# Patient Record
Sex: Male | Born: 1960
Health system: Southern US, Community
[De-identification: ages and names within clinical notes are randomized; demographics above are authoritative.]

## PROBLEM LIST (undated history)

## (undated) DIAGNOSIS — Z8619 Personal history of other infectious and parasitic diseases: Secondary | ICD-10-CM

## (undated) DIAGNOSIS — E669 Obesity, unspecified: Secondary | ICD-10-CM

## (undated) HISTORY — DX: Obesity, unspecified: E66.9

## (undated) HISTORY — DX: Personal history of other infectious and parasitic diseases: Z86.19

---

## 2011-05-27 ENCOUNTER — Ambulatory Visit: Payer: Self-pay | Admitting: Family Medicine

## 2011-05-30 ENCOUNTER — Ambulatory Visit: Payer: Self-pay | Admitting: Family Medicine

## 2011-06-13 ENCOUNTER — Encounter: Payer: Self-pay | Admitting: Family Medicine

## 2011-06-13 ENCOUNTER — Ambulatory Visit (INDEPENDENT_AMBULATORY_CARE_PROVIDER_SITE_OTHER): Payer: BC Managed Care – PPO | Admitting: Family Medicine

## 2011-06-13 VITALS — BP 126/78 | HR 82 | Temp 97.3°F | Ht 74.0 in | Wt 291.2 lb

## 2011-06-13 DIAGNOSIS — E669 Obesity, unspecified: Secondary | ICD-10-CM

## 2011-06-13 DIAGNOSIS — IMO0001 Reserved for inherently not codable concepts without codable children: Secondary | ICD-10-CM | POA: Insufficient documentation

## 2011-06-13 DIAGNOSIS — Z0000 Encounter for general adult medical examination without abnormal findings: Secondary | ICD-10-CM

## 2011-06-13 NOTE — Assessment & Plan Note (Signed)
Preventative protocols reviewed and updated unless pt declined. Return fasting for blood work. Discussed healthy diet/living.

## 2011-06-13 NOTE — Patient Instructions (Signed)
Good to meet you today! Call us with questions. Return at your convenience fasting for blood work. We will check cholesterol and sugar. Return in 1 year for next physical.

## 2011-06-13 NOTE — Progress Notes (Signed)
Subjective:    Patient ID: Martin Herring, male    DOB: 04-22-1960, 51 y.o.   MRN: [id number]  HPI CC: new pt establish  No recent PCP.  Glaucoma - detected 1995, on meds initially but currently off, gets checked yearly at [facility].  No questions or concerns today.  Did have fall and bruised tailbone recently.  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 Off and on back pain - sees chiropractor.  L 3,4,5 disc bulge intermittently  Preventative: No recent CPE. Colon screen - no fmhx colon cancer.  No blood in stool.  No BM changes. Prostate screen - no fmhx prostate cancer.  No nocturia.  Average stream. Tetanus - 2011, stitches to hand Flu - no flu shot Seat belt - 100% use discussed (doesn't do) Sunscreen use discussed.  Head wear discussed  Body mass index is 37.39 kg/(m^2).  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  Past histories reviewed and updated if relevant as below. There is no problem list on file for this patient.  Past Medical History  Diagnosis Date  . History of chicken pox   . Glaucoma     No treatment currently; under close observation  . Obesity    No past surgical history on file. History  Substance Use Topics  . Smoking status: Never Smoker   . Smokeless tobacco: Current User    Types: Chew   Comment: 1/2 ppd chewing  . Alcohol Use: No   Family History  Problem Relation Age of Onset  . Stroke Father 6  . Diabetes Paternal Uncle   . Cancer Paternal Uncle     lung (smoker), pancreas  . Coronary artery disease Paternal Grandmother 38    MI  . Stroke Paternal Grandmother 108   Allergies  Allergen Reactions  . Penicillins Other (See Comments)    Unknown reaction; was told as a  child he had a reaction   No current outpatient prescriptions on file prior to visit.    Review of Systems  Constitutional: Negative for fever, chills, activity change, appetite change, fatigue and unexpected weight change.  HENT: Negative for hearing loss and neck pain.   Eyes: Negative for visual disturbance.  Respiratory: Negative for cough, chest tightness, shortness of breath and wheezing.   Cardiovascular: Negative for chest pain, palpitations and leg swelling.  Gastrointestinal: Negative for nausea, vomiting, abdominal pain, diarrhea, constipation, blood in stool and abdominal distention.  Genitourinary: Negative for hematuria and difficulty urinating.  Musculoskeletal: Negative for myalgias and arthralgias.  Skin: Negative for rash.  Neurological: Negative for dizziness, seizures, syncope and headaches.  Hematological: Does not bruise/bleed easily.  Psychiatric/Behavioral: Negative for dysphoric mood. The patient is not nervous/anxious.        Objective:   Physical Exam  Nursing note and vitals reviewed. Constitutional: He is oriented to person, place, and time. He appears well-developed and well-nourished. No distress.  HENT:  Head: Normocephalic and atraumatic.  Right Ear: Hearing, tympanic membrane, external ear and ear canal normal.  Left Ear: Hearing, tympanic membrane, external ear and ear canal normal.  Nose: Nose normal.  Mouth/Throat: Uvula is midline, oropharynx is clear and moist and mucous membranes are normal. No oropharyngeal exudate, posterior oropharyngeal edema, posterior oropharyngeal erythema or tonsillar abscesses.  Eyes: Conjunctivae  and EOM are normal. Pupils are equal, round, and reactive to light. No scleral icterus.  Neck: Normal range of motion. Neck supple.  Cardiovascular: Normal rate, regular rhythm, normal heart sounds and intact distal pulses.   No murmur heard. Pulses:      Radial pulses are 2+ on the right side, and 2+ on the left side.    Pulmonary/Chest: Effort normal and breath sounds normal. No respiratory distress. He has no wheezes. He has no rales.  Abdominal: Soft. Bowel sounds are normal. He exhibits no distension and no mass. There is no tenderness. There is no rebound and no guarding.  Musculoskeletal: Normal range of motion. He exhibits no edema.  Lymphadenopathy:    He has no cervical adenopathy.  Neurological: He is alert and oriented to person, place, and time.       CN grossly intact, station and gait intact  Skin: Skin is warm and dry. No rash noted.  Psychiatric: He has a normal mood and affect. His behavior is normal. Judgment and thought content normal.       Assessment & Plan:

## 2011-06-14 ENCOUNTER — Other Ambulatory Visit (INDEPENDENT_AMBULATORY_CARE_PROVIDER_SITE_OTHER): Payer: BC Managed Care – PPO

## 2011-06-14 DIAGNOSIS — Z0000 Encounter for general adult medical examination without abnormal findings: Secondary | ICD-10-CM

## 2011-06-14 DIAGNOSIS — E039 Hypothyroidism, unspecified: Secondary | ICD-10-CM

## 2011-06-14 LAB — LIPID PANEL
Cholesterol: 179 mg/dL (ref 0–200)
Total CHOL/HDL Ratio: 4
Triglycerides: 136 mg/dL (ref 0.0–149.0)

## 2011-06-14 LAB — COMPREHENSIVE METABOLIC PANEL
AST: 21 U/L (ref 0–37)
Albumin: 4.1 g/dL (ref 3.5–5.2)
BUN: 17 mg/dL (ref 6–23)
Calcium: 9.5 mg/dL (ref 8.4–10.5)
Chloride: 107 mEq/L (ref 96–112)
Glucose, Bld: 87 mg/dL (ref 70–99)
Potassium: 4.6 mEq/L (ref 3.5–5.1)

## 2011-06-14 LAB — TSH: TSH: 0.85 u[IU]/mL (ref 0.35–5.50)

## 2011-06-14 NOTE — Progress Notes (Signed)
Addended by: Eustaquio Boyden on: 06/14/2011 07:45 AM   Modules accepted: Orders

## 2011-07-11 ENCOUNTER — Telehealth: Payer: Self-pay | Admitting: *Deleted

## 2011-07-11 NOTE — Telephone Encounter (Signed)
Patient never received lab results. I was never made aware to notify patient. Message left notifying him of normal results. Advised of error and to call with any questions or concerns.

## 2011-07-11 NOTE — Telephone Encounter (Signed)
Noted thanks °

## 2011-07-11 NOTE — Telephone Encounter (Signed)
Opened in error

## 2011-09-27 ENCOUNTER — Encounter: Payer: Self-pay | Admitting: Family Medicine

## 2011-09-27 ENCOUNTER — Ambulatory Visit (INDEPENDENT_AMBULATORY_CARE_PROVIDER_SITE_OTHER): Payer: BC Managed Care – PPO | Admitting: Family Medicine

## 2011-09-27 VITALS — BP 110/80 | HR 68 | Temp 98.0°F | Wt 288.2 lb

## 2011-09-27 DIAGNOSIS — R220 Localized swelling, mass and lump, head: Secondary | ICD-10-CM

## 2011-09-27 DIAGNOSIS — R221 Localized swelling, mass and lump, neck: Secondary | ICD-10-CM

## 2011-09-27 NOTE — Assessment & Plan Note (Signed)
Anticipate head swelling due to either not recalled trauma or possibly developing epidermal cyst. rec warm compresses, update [place] if area coming to head or not resolving as expected.

## 2011-09-27 NOTE — Progress Notes (Signed)
  Subjective:    Patient ID: Martin Herring, male    DOB: 05/15/1960, 51 y.o.   MRN: [id number]  HPI CC: check knot on head.  3-4 d ago noticed swelling/knot on head.  Denies trauma/injury.  Doesn't think has had bug bite.  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  Light headache associated with this.  No fevers/chills, n/v, dizziness.  Did have 1 episode of vomiting after shoveling grain in silo.  Thinks overheated.  No prior h/o bumps on head.  Review of Systems Per hpi    Objective:   Physical Exam WDWN obese caucasian male, NAD No occipital or auricular or cervical LAD Right posterior skull with tender lump/swelling, no overlying erythema or warmth.  No obvious cyst.    Assessment & Plan:

## 2011-10-10 ENCOUNTER — Ambulatory Visit (INDEPENDENT_AMBULATORY_CARE_PROVIDER_SITE_OTHER)
Admission: RE | Admit: 2011-10-10 | Discharge: 2011-10-10 | Disposition: A | Discharge status: Home / Self Care | Payer: BC Managed Care – PPO | Source: Ambulatory Visit | Attending: Family Medicine | Admitting: Family Medicine

## 2011-10-10 ENCOUNTER — Ambulatory Visit (INDEPENDENT_AMBULATORY_CARE_PROVIDER_SITE_OTHER): Payer: BC Managed Care – PPO | Admitting: Family Medicine

## 2011-10-10 ENCOUNTER — Encounter: Payer: Self-pay | Admitting: Family Medicine

## 2011-10-10 VITALS — BP 120/80 | HR 62 | Temp 98.5°F | Ht 74.0 in | Wt 290.8 lb

## 2011-10-10 DIAGNOSIS — S6980XA Other specified injuries of unspecified wrist, hand and finger(s), initial encounter: Secondary | ICD-10-CM

## 2011-10-10 DIAGNOSIS — IMO0001 Reserved for inherently not codable concepts without codable children: Secondary | ICD-10-CM

## 2011-10-10 DIAGNOSIS — S6990XA Unspecified injury of unspecified wrist, hand and finger(s), initial encounter: Secondary | ICD-10-CM

## 2011-10-10 DIAGNOSIS — S6992XA Unspecified injury of left wrist, hand and finger(s), initial encounter: Secondary | ICD-10-CM | POA: Insufficient documentation

## 2011-10-10 MED ORDER — DOXYCYCLINE HYCLATE 100 MG PO CAPS: 100.0000 mg | ORAL_CAPSULE | Freq: Two times a day (BID) | ORAL | Status: AC

## 2011-10-10 NOTE — Assessment & Plan Note (Addendum)
Xray with evident radioopaque foreign body.  Concern for developing infection.   I think that risks of keeping object in outweighs risk of procedure to remove object. Place on PO abx to cover possible developing cellulitis (doxy given somewhat purulent). rtc 1 wk for f/u.

## 2011-10-10 NOTE — Progress Notes (Addendum)
  Subjective:    Patient ID: Deckard Stuber, male    DOB: 1960-07-04, 52 y.o.   MRN: [id number]  HPI CC: hand injuries  Metal in finger - occurred Tuesday - taking piece of wheel bearing, hit with hammer and thinks metal splinter went into right hand.  R lateral middle finger between PIP and DIP.  Saturday using hammer again, hit left thumb with hammer and skin at tip of thumb came off.  Was not using gloves.  Tetanus shot received 2011 per pt.  Review of Systems Per HPI    Objective:   Physical Exam WDWN CM NAD Right 3rd finger - laterally between DIP/PIP there is small laceration that drains small amt of serous fluid when expressed.  Tender.  No warmth but slight erythema present.  Able to fully flex/extend at DIP/PIP of R third finger Left thumb - distal tip medially laceration with edge of skin actually overlying fingernail.    Assessment & Plan:  IC obtained and in chart.  Digital block of R middle finger achieved using 3cc total of epinephrine buffered with bicarb.  Then site cleaned with betadine and forceps used to remove small metal shard, 8mm in length.  Opening irrigated with sterile water.  Area dressed with abx ointment and gauze.  Pt tolerated procedure well.

## 2011-10-10 NOTE — Assessment & Plan Note (Signed)
Able to push skin under edge of nail. Monitor for now. Discussed home care - soapy water soaks daily.  Will recheck in 1 wk. Hopefully won't need further intervention.

## 2011-10-10 NOTE — Patient Instructions (Signed)
Take antibiotic for infection. Watch for draining pus, spreading redness or worsening pain.  If that happens, please return to see [place] sooner. Otherwise, return to see me in 1 week.

## 2011-10-17 ENCOUNTER — Ambulatory Visit: Payer: BC Managed Care – PPO | Admitting: Family Medicine

## 2011-10-17 ENCOUNTER — Encounter: Payer: Self-pay | Admitting: Family Medicine

## 2011-10-17 VITALS — BP 136/78 | HR 68 | Temp 97.6°F | Wt 291.2 lb

## 2011-10-17 DIAGNOSIS — IMO0001 Reserved for inherently not codable concepts without codable children: Secondary | ICD-10-CM

## 2011-10-17 DIAGNOSIS — S6990XA Unspecified injury of unspecified wrist, hand and finger(s), initial encounter: Secondary | ICD-10-CM

## 2011-10-17 DIAGNOSIS — S6992XA Unspecified injury of left wrist, hand and finger(s), initial encounter: Secondary | ICD-10-CM

## 2011-10-17 DIAGNOSIS — S6980XA Other specified injuries of unspecified wrist, hand and finger(s), initial encounter: Secondary | ICD-10-CM

## 2011-10-17 NOTE — Patient Instructions (Signed)
Continue to keep an eye on finger.  If redness spreading or draining pus, please return to see me. Call us with questions.

## 2011-10-17 NOTE — Progress Notes (Signed)
  Subjective:    Patient ID: Martin Herring, male    DOB: Dec 19, 1960, 51 y.o.   MRN: [id number]  HPI CC: recehck  See prior note for details.  Overall improving.  Right middle finger improved, not tender. Left tip of thumb still sore, able to push skin below nail.  Some GI upset with doxy but able to tolerate better when takes with food.  Review of Systems Per HPI    Objective:   Physical Exam  Nursing note and vitals reviewed. Constitutional: He appears well-developed and well-nourished. No distress.  Musculoskeletal:       Right 3rd finger - laterally between DIP/PIP laceration present but swelling somewhat decreased.  No fluid expressed.  Able to fully flex/extend at DIP/PIP of R third finger Left thumb - distal tip medially laceration with edge of skin overlying fingernail but able to reduce below fingernail       Assessment & Plan:

## 2011-10-17 NOTE — Assessment & Plan Note (Signed)
Healing well. Finish abx.

## 2011-10-17 NOTE — Assessment & Plan Note (Signed)
Will continue to monitor for now. To return if unable to reduce edge of skin (would have concern for ingrown nail) or if draining pus or redness/tenderness worsening. Hopeful will heal without further intervention.

## 2012-09-18 ENCOUNTER — Encounter: Payer: Self-pay | Admitting: Family Medicine

## 2012-09-18 ENCOUNTER — Ambulatory Visit (INDEPENDENT_AMBULATORY_CARE_PROVIDER_SITE_OTHER): Payer: BC Managed Care – PPO | Admitting: Family Medicine

## 2012-09-18 VITALS — BP 110/70 | HR 60 | Temp 98.1°F | Wt 286.0 lb

## 2012-09-18 DIAGNOSIS — R5383 Other fatigue: Secondary | ICD-10-CM

## 2012-09-18 DIAGNOSIS — T148 Other injury of unspecified body region: Secondary | ICD-10-CM

## 2012-09-18 DIAGNOSIS — R5381 Other malaise: Secondary | ICD-10-CM

## 2012-09-18 DIAGNOSIS — W57XXXA Bitten or stung by nonvenomous insect and other nonvenomous arthropods, initial encounter: Secondary | ICD-10-CM

## 2012-09-18 LAB — COMPREHENSIVE METABOLIC PANEL
Alkaline Phosphatase: 73 U/L (ref 39–117)
BUN: 13 mg/dL (ref 6–23)
Creatinine, Ser: 1.1 mg/dL (ref 0.4–1.5)
Glucose, Bld: 79 mg/dL (ref 70–99)
Total Bilirubin: 0.5 mg/dL (ref 0.3–1.2)

## 2012-09-18 LAB — CBC WITH DIFFERENTIAL/PLATELET
Eosinophils Relative: 4.6 % (ref 0.0–5.0)
HCT: 47.1 % (ref 39.0–52.0)
Hemoglobin: 15.6 g/dL (ref 13.0–17.0)
Lymphs Abs: 1.6 10*3/uL (ref 0.7–4.0)
Monocytes Relative: 10.2 % (ref 3.0–12.0)
Neutro Abs: 4.7 10*3/uL (ref 1.4–7.7)
WBC: 7.4 10*3/uL (ref 4.5–10.5)

## 2012-09-18 LAB — TSH: TSH: 0.31 u[IU]/mL — ABNORMAL LOW (ref 0.35–5.50)

## 2012-09-18 NOTE — Progress Notes (Signed)
  Subjective:    Patient ID: Martin Herring, male    DOB: 1960/08/18, 52 y.o.   MRN: [id number]  HPI CC: fatigue  Several months ago removed tick on right side, unsure duration.  3d ago removed tick from L shoulder. Now endorses 1 mo h/o progressively worsening fatigue, drowsiness, some lightheadedness.  Especially bad last 3-4 days.  Actually describes more difficulty with decreased energy than with daytime somnolence.  No rashes, fevers/chills, abd pain or nausea, night sweats.  H/o chronic joint pains and back pain.  Sleeping well at night - averages 6 hours, naps during the day.  No snoring that he knows of.  Wt Readings from Last 3 Encounters:  09/18/12 286 lb (129.729 kg)  10/17/11 291 lb 4 oz (132.11 kg)  10/10/11 290 lb 12 oz (131.883 kg)  dropped 12 lb in last 2 weeks - working on [employer].  Past Medical History  Diagnosis Date  . History of chicken pox   . Glaucoma     No treatment currently; under close observation  . Obesity     Review of Systems Per HPI    Objective:   Physical Exam  Nursing note and vitals reviewed. Constitutional: He appears well-developed and well-nourished. No distress.  HENT:  Head: Normocephalic and atraumatic.  Mouth/Throat: Oropharynx is clear and moist. No oropharyngeal exudate.  Eyes: Conjunctivae and EOM are normal. Pupils are equal, round, and reactive to light. No scleral icterus.  Neck: Normal range of motion. Neck supple.  Cardiovascular: Normal rate, regular rhythm, normal heart sounds and intact distal pulses.   No murmur heard. Pulmonary/Chest: Effort normal and breath sounds normal. No respiratory distress. He has no wheezes. He has no rales.  Musculoskeletal: He exhibits no edema.  Skin: Skin is warm and dry. No rash noted.  Tick bite on R groin region - possible retained head - removed with 18g needle and cleaned with alcohol swab.  Psychiatric: He has a normal mood and affect.       Assessment & Plan:

## 2012-09-18 NOTE — Addendum Note (Signed)
Addended by: Eustaquio Boyden on: 09/18/2012 10:49 PM   Modules accepted: Level of Service

## 2012-09-18 NOTE — Patient Instructions (Signed)
Lets check some blood work today to rule out reversible causes of fatigue.  We will call you with results I recommend increase sleep to 7 hours a night. Ensure good diverse diet.  Good to see you today, call us with questions.

## 2012-09-18 NOTE — Assessment & Plan Note (Signed)
Check for reversible causes of fatigue. Given recent tick bite and possible retained head, will check lyme disease titers Encouraged increased sleep, and discussed healthy diet. Pt agrees with plan.

## 2012-09-19 LAB — B. BURGDORFI ANTIBODIES: B burgdorferi Ab IgG+IgM: 0.42 {ISR}

## 2012-09-21 ENCOUNTER — Other Ambulatory Visit: Payer: Self-pay | Admitting: Family Medicine

## 2012-09-21 DIAGNOSIS — E0590 Thyrotoxicosis, unspecified without thyrotoxic crisis or storm: Secondary | ICD-10-CM

## 2012-09-21 DIAGNOSIS — E538 Deficiency of other specified B group vitamins: Secondary | ICD-10-CM

## 2012-10-01 ENCOUNTER — Other Ambulatory Visit (INDEPENDENT_AMBULATORY_CARE_PROVIDER_SITE_OTHER): Payer: BC Managed Care – PPO

## 2012-10-01 DIAGNOSIS — E0590 Thyrotoxicosis, unspecified without thyrotoxic crisis or storm: Secondary | ICD-10-CM

## 2012-10-01 DIAGNOSIS — E538 Deficiency of other specified B group vitamins: Secondary | ICD-10-CM

## 2012-10-01 LAB — T4, FREE: Free T4: 0.84 ng/dL (ref 0.60–1.60)

## 2012-10-02 LAB — T3: T3, Total: 115.4 ng/dL (ref 80.0–204.0)

## 2012-10-02 LAB — HOMOCYSTEINE: Homocysteine: 8.5 umol/L (ref 4.0–15.4)

## 2012-10-03 LAB — METHYLMALONIC ACID, SERUM: Methylmalonic Acid, Quant: 0.14 umol/L (ref <0.40)

## 2012-10-04 ENCOUNTER — Encounter: Payer: Self-pay | Admitting: *Deleted

## 2014-08-12 IMAGING — CR DG FINGER MIDDLE 2+V*R*
2 series · 2 of 2 positions shown · non-contrast
Comparison: None.

CLINICAL DATA: Injury

RIGHT MIDDLE FINGER 2+V

[view not recorded (1 of 2)]
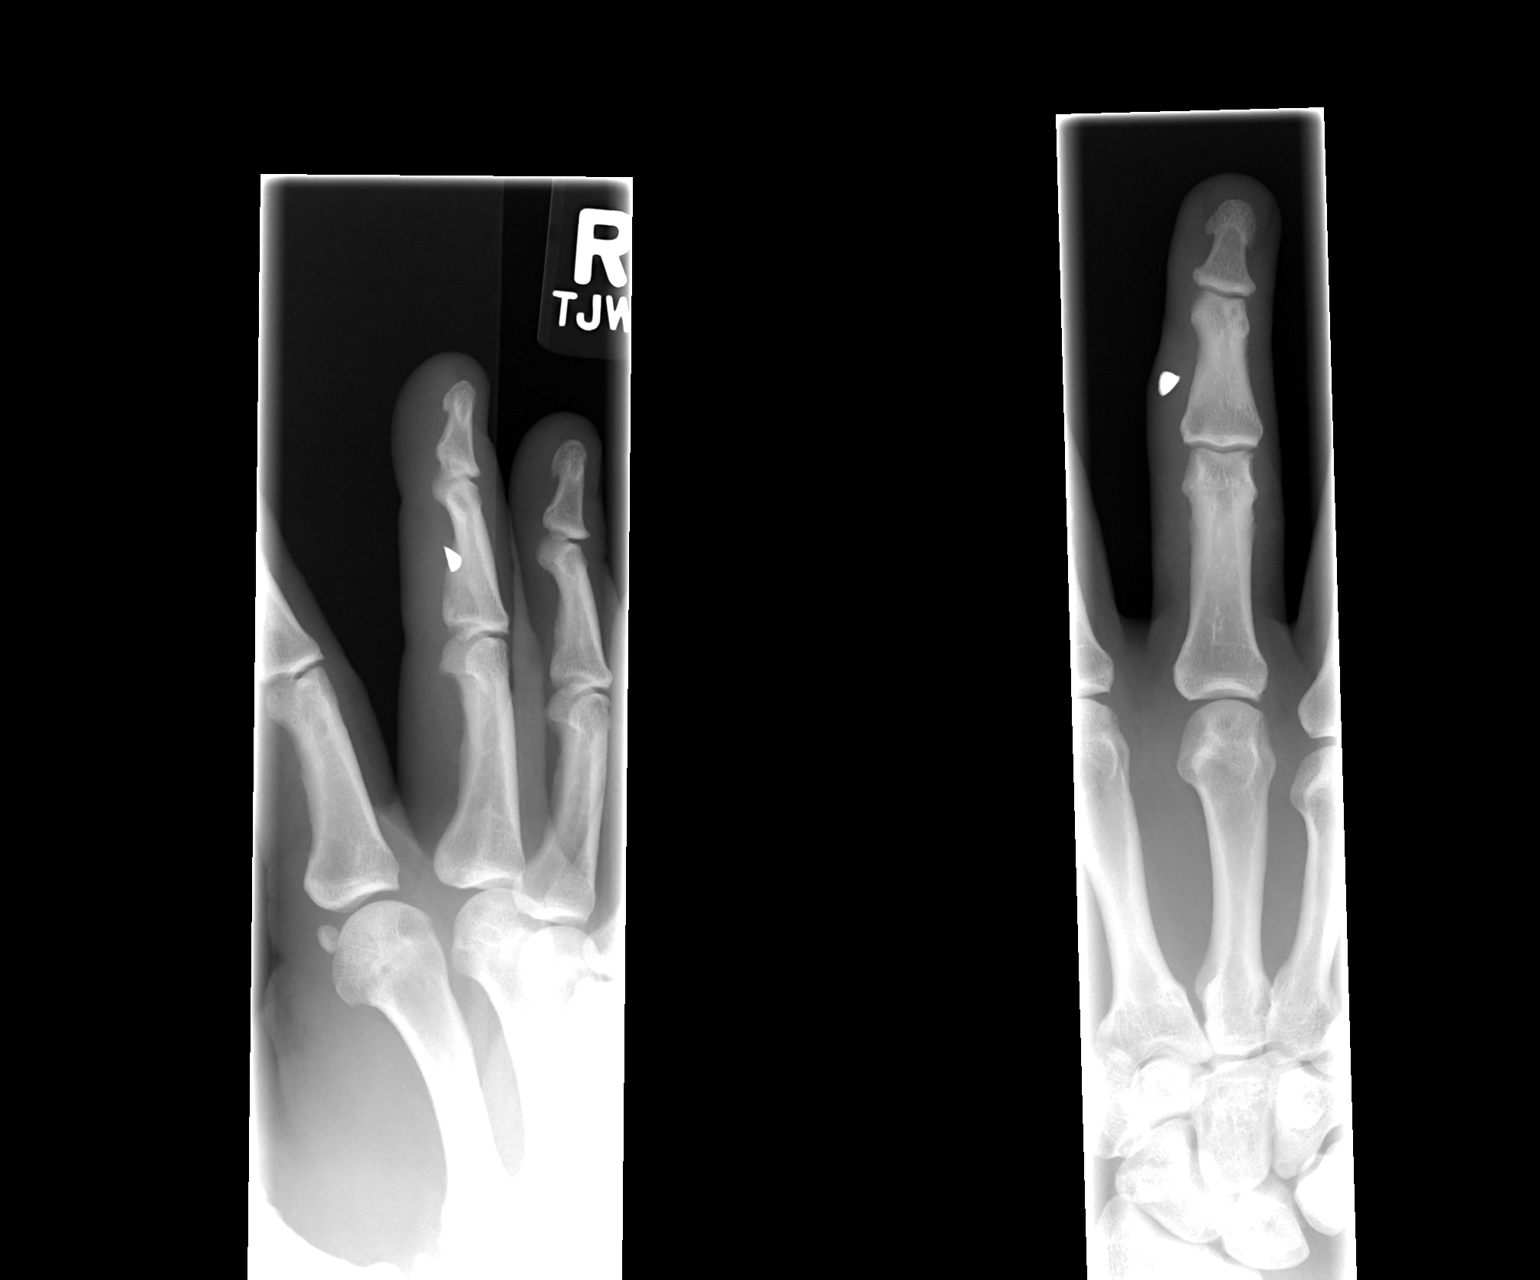

[view not recorded (2 of 2)]
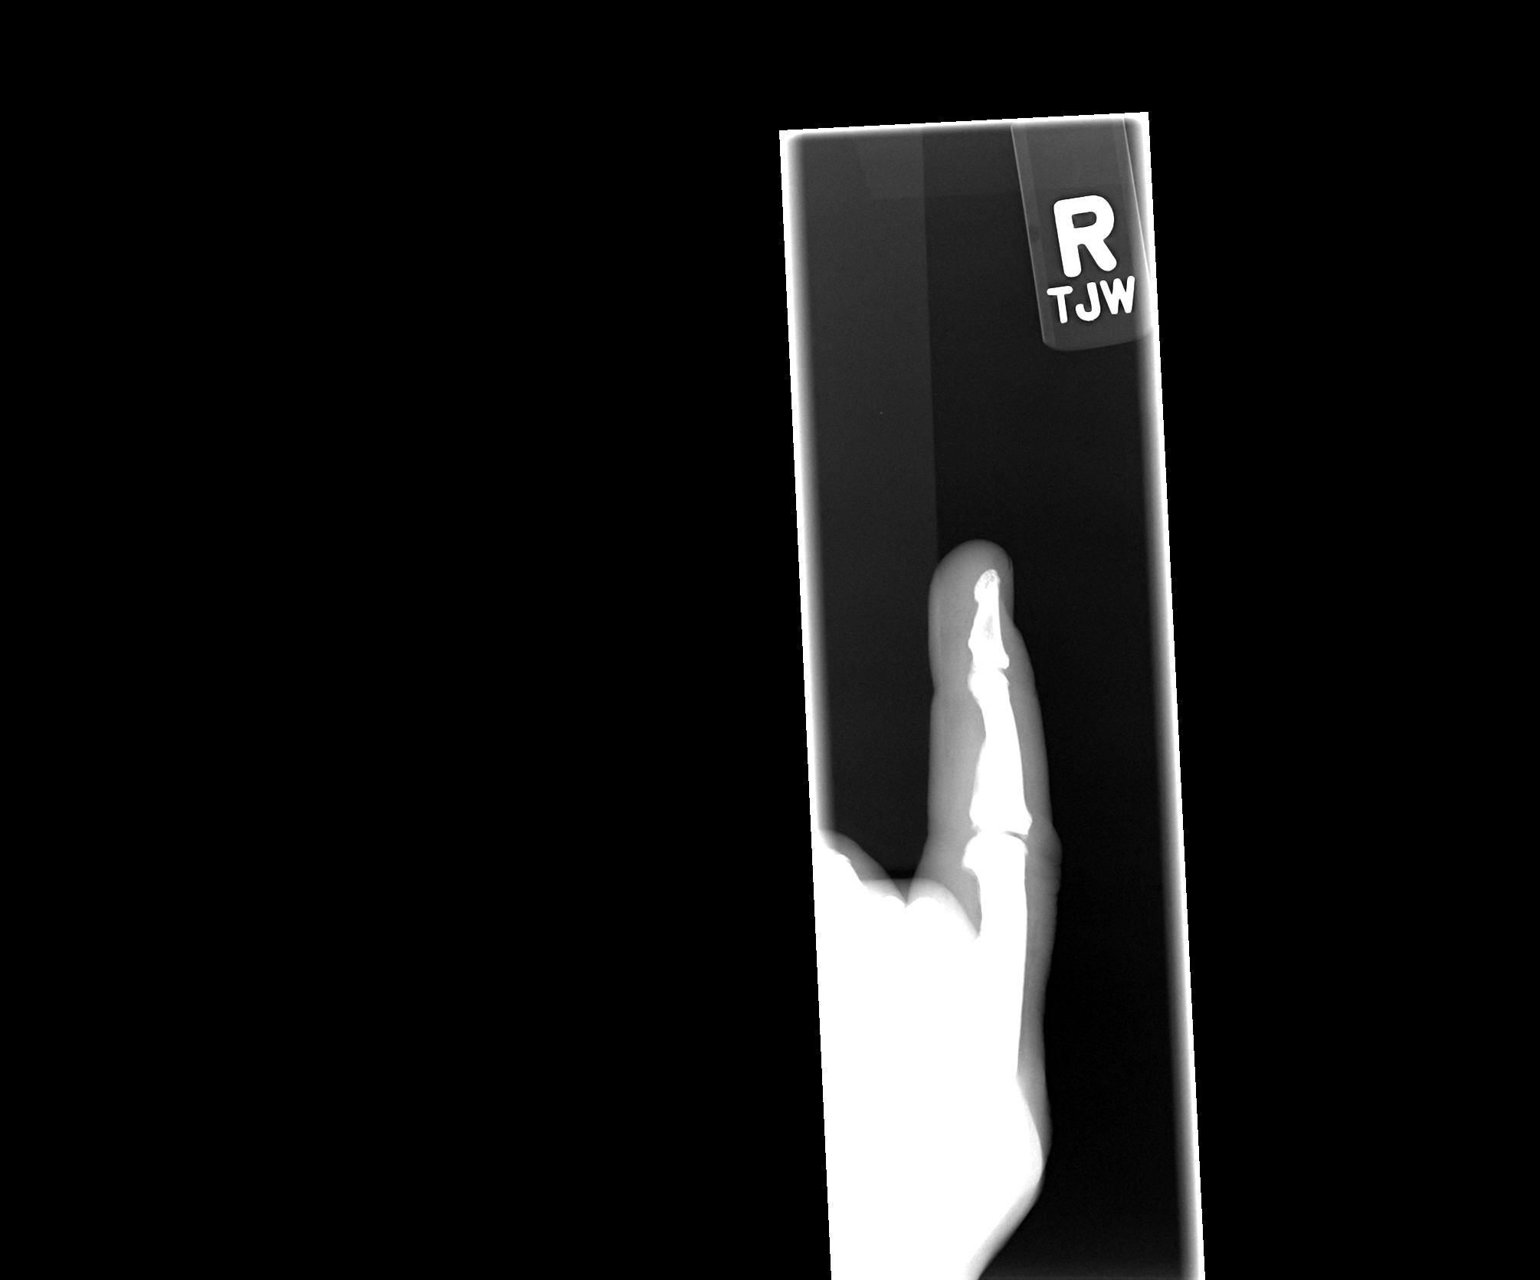

[2 of 2 positions shown; findings below may reference images not displayed]

FINDINGS: Three views of the right third finger submitted.  There
is a metallic foreign body within soft tissue adjacent to middle
phalanx measures 4.3 mm.  No acute fracture or subluxation.
IMPRESSION: No acute fracture or subluxation.  Metallic foreign body within
soft tissue adjacent to middle phalanx measures 4.3 mm.

## 2015-05-27 ENCOUNTER — Telehealth: Payer: Self-pay | Admitting: Family Medicine

## 2015-05-27 NOTE — Telephone Encounter (Signed)
Patient Name: Martin GosselinDONALD Herring  DOB: December 11, 1960    Initial Comment Caller states husband was bit by tick, pulled it out, head was embedded, everything came out, tick was brown with white spot    Nurse Assessment  Nurse: Scarlette ArStandifer, RN, Heather Date/Time (Eastern Time): 05/27/2015 10:12:49 AM  Confirm and document reason for call. If symptomatic, describe symptoms. You must click the next button to save text entered. ---Caller states husband was bit by tick, pulled it out, head was embedded, everything came out, tick was brown with white spot. Yesterday he felt sleepy all day and had a headache. He found the tick yesterday. Today he feels fine.  Has the patient traveled out of the country within the last 30 days? ---Not Applicable  Does the patient have any new or worsening symptoms? ---Yes  Will a triage be completed? ---Yes  Related visit to physician within the last 2 weeks? ---No  Does the PT have any chronic conditions? (i.e. diabetes, asthma, etc.) ---Yes  List chronic conditions. ---see MR  Is this a behavioral health or substance abuse call? ---No     Guidelines    Guideline Title Affirmed Question Affirmed Notes  Tick Bite Tick bite with no complications (all triage questions negative)    Final Disposition User   Home Care Standifer, RN, Research scientist (physical sciences)Heather    Disagree/Comply: Comply

## 2015-05-27 NOTE — Telephone Encounter (Signed)
PLEASE NOTE: All timestamps contained within this report are represented as [place] Standard Time. CONFIDENTIALTY NOTICE: This fax transmission is intended only for the addressee. It contains information that is legally privileged, confidential or otherwise protected from use or disclosure. If you are not the intended recipient, you are strictly prohibited from reviewing, disclosing, copying using or disseminating any of this information or taking any action in reliance on or regarding this information. If you have received this fax in error, please notify us immediately by telephone so that we can arrange for its return to [place]. Phone: [phone], Toll-Free: [phone], Fax: [phone] Page: 1 of 2 Call Id: [id number] [facility] Day - Client TELEPHONE ADVICE RECORD [facility] Patient Name: Martin Herring Gender: Male DOB: May 05, 1960 Age: 55 Y 21 D Return Phone Number: [phone] (Primary) Address: City/State/Zip: [place] Client [facility] [place] Day - Client Client Site [facility] [place] - Day Physician Eustaquio Boyden Contact Type Call Who Is Calling Patient / Member / Family / Caregiver Call Type Triage / Clinical Caller Name Lennox Dolberry Relationship To Patient Spouse Return Phone Number [phone] (Primary) Chief Complaint Tick Bite Reason for Call Symptomatic / Request for Health Information Initial Comment Caller states husband was bit by tick, pulled it out, head was embedded, everything came out, tick was brown with white spot Appointment Disposition EMR Appointment Not Necessary Info pasted into Epic Yes PreDisposition Call Doctor Translation No Nurse Assessment Nurse: Scarlette Ar, RN, Heather Date/Time (Eastern Time): 05/27/2015 10:12:49 AM Confirm and document reason for call. If symptomatic, describe symptoms. You must click the next button to save text entered. ---Caller states husband was bit  by tick, pulled it out, head was embedded, everything came out, tick was brown with white spot. Yesterday he felt sleepy all day and had a headache. He found the tick yesterday. Today he feels fine. Has the patient traveled out of the country within the last 30 days? ---Not Applicable Does the patient have any new or worsening symptoms? ---Yes Will a triage be completed? ---Yes Related visit to physician within the last 2 weeks? ---No Does the PT have any chronic conditions? (i.e. diabetes, asthma, etc.) ---Yes List chronic conditions. ---see MR Is this a behavioral health or substance abuse call? ---No Guidelines Guideline Title Affirmed Question Affirmed Notes Nurse Date/Time (Eastern Time) Tick Bite Tick bite with no complications (all triage questions negative) Standifer, RN, Herbert Seta 05/27/2015 10:12:59 AM PLEASE NOTE: All timestamps contained within this report are represented as [place] Standard Time. CONFIDENTIALTY NOTICE: This fax transmission is intended only for the addressee. It contains information that is legally privileged, confidential or otherwise protected from use or disclosure. If you are not the intended recipient, you are strictly prohibited from reviewing, disclosing, copying using or disseminating any of this information or taking any action in reliance on or regarding this information. If you have received this fax in error, please notify us immediately by telephone so that we can arrange for its return to [place]. Phone: [phone], Toll-Free: [phone], Fax: [phone] Page: 2 of 2 Call Id: [id number] Disp. Time Lamount Cohen Time) Disposition Final User 05/27/2015 10:00:50 AM Attempt made - message left Standifer, RN, Herbert Seta 05/27/2015 10:23:16 AM Call Completed Standifer, RN, Herbert Seta 05/27/2015 10:21:05 AM Home Care Yes Standifer, RN, Sibyl Parr Understands: Yes Disagree/Comply: Comply Care Advice Given Per Guideline CALL BACK IF: * Fever or rash occur in the  next 2 weeks * Bite begins to look infected * You become  worse. CARE ADVICE given per Tick Bites (Adult) guideline. HOME CARE: You should be able to treat this at home. REASSURANCE: Most tick bites are harmless and can be treated at home. The spread of disease by ticks is rare. TETANUS BOOSTER: If last tetanus shot was given over 10 years ago, needs a booster. Call PCP during regular office hours (within 3 days) ANTIBIOTIC OINTMENT: Wash the wound and your hands with soap and water after removal to prevent catching any tick disease. Apply antibiotic ointment (OTC) to the bite once.

## 2015-06-02 ENCOUNTER — Ambulatory Visit (INDEPENDENT_AMBULATORY_CARE_PROVIDER_SITE_OTHER): Payer: BLUE CROSS/BLUE SHIELD | Admitting: Family Medicine

## 2015-06-02 ENCOUNTER — Encounter: Payer: Self-pay | Admitting: Family Medicine

## 2015-06-02 VITALS — BP 122/80 | HR 59 | Temp 97.9°F | Wt 301.5 lb

## 2015-06-02 DIAGNOSIS — M25512 Pain in left shoulder: Secondary | ICD-10-CM

## 2015-06-02 DIAGNOSIS — S80862A Insect bite (nonvenomous), left lower leg, initial encounter: Secondary | ICD-10-CM

## 2015-06-02 DIAGNOSIS — W57XXXA Bitten or stung by nonvenomous insect and other nonvenomous arthropods, initial encounter: Secondary | ICD-10-CM | POA: Diagnosis not present

## 2015-06-02 NOTE — Assessment & Plan Note (Signed)
No signs of tick borne illness. Anticipate mild papular rash around tick bite site is local reaction to neosporin. rec only use vaseline. Red flags to seek urgent care discussed, as well as sxs indicative of needing abx course - pt will call us if any of these develop.

## 2015-06-02 NOTE — Progress Notes (Signed)
Pre visit review using our clinic review tool, if applicable. No additional management support is needed unless otherwise documented below in the visit note. 

## 2015-06-02 NOTE — Patient Instructions (Signed)
Watch for any fever, chills, abdominal pain, nausea, headache, or rash over the next week. If any of this happens, let me know for antibiotic course.  Tick Bite Information Ticks are insects that attach themselves to the skin and draw blood for food. There are various types of ticks. Common types include wood ticks and deer ticks. Most ticks live in shrubs and grassy areas. Ticks can climb onto your body when you make contact with leaves or grass where the tick is waiting. The most common places on the body for ticks to attach themselves are the scalp, neck, armpits, waist, and groin. Most tick bites are harmless, but sometimes ticks carry germs that cause diseases. These germs can be spread to a person during the tick's feeding process. The chance of a disease spreading through a tick bite depends on:   The type of tick.  Time of year.   How long the tick is attached.   Geographic location.  HOW CAN YOU PREVENT TICK BITES? Take these steps to help prevent tick bites when you are outdoors:  Wear protective clothing. Long sleeves and long pants are best.   Wear white clothes so you can see ticks more easily.  Tuck your pant legs into your socks.   If walking on a trail, stay in the middle of the trail to avoid brushing against bushes.  Avoid walking through areas with long grass.  Put insect repellent on all exposed skin and along boot tops, pant legs, and sleeve cuffs.   Check clothing, hair, and skin repeatedly and before going inside.   Brush off any ticks that are not attached.  Take a shower or bath as soon as possible after being outdoors.  WHAT IS THE PROPER WAY TO REMOVE A TICK? Ticks should be removed as soon as possible to help prevent diseases caused by tick bites. 1. If latex gloves are available, put them on before trying to remove a tick.  2. Using fine-point tweezers, grasp the tick as close to the skin as possible. You may also use curved forceps or a  tick removal tool. Grasp the tick as close to its head as possible. Avoid grasping the tick on its body. 3. Pull gently with steady upward pressure until the tick lets go. Do not twist the tick or jerk it suddenly. This may break off the tick's head or mouth parts. 4. Do not squeeze or crush the tick's body. This could force disease-carrying fluids from the tick into your body.  5. After the tick is removed, wash the bite area and your hands with soap and water or other disinfectant such as alcohol. 6. Apply a small amount of antiseptic cream or ointment to the bite site.  7. Wash and disinfect any instruments that were used.  Do not try to remove a tick by applying a hot match, petroleum jelly, or fingernail polish to the tick. These methods do not work and may increase the chances of disease being spread from the tick bite.  WHEN SHOULD YOU SEEK MEDICAL CARE? Contact your health care provider if you are unable to remove a tick from your skin or if a part of the tick breaks off and is stuck in the skin.  After a tick bite, you need to be aware of signs and symptoms that could be related to diseases spread by ticks. Contact your health care provider if you develop any of the following in the days or weeks after the tick bite:  Unexplained fever.  Rash. A circular rash that appears days or weeks after the tick bite may indicate the possibility of Lyme disease. The rash may resemble a target with a bull's-eye and may occur at a different part of your body than the tick bite.  Redness and swelling in the area of the tick bite.   Tender, swollen lymph glands.   Diarrhea.   Weight loss.   Cough.   Fatigue.   Muscle, joint, or bone pain.   Abdominal pain.   Headache.   Lethargy or a change in your level of consciousness.  Difficulty walking or moving your legs.   Numbness in the legs.   Paralysis.  Shortness of breath.   Confusion.   Repeated vomiting.      This information is not intended to replace advice given to you by your health care provider. Make sure you discuss any questions you have with your health care provider.   Document Released: 02/05/2000 Document Revised: 02/28/2014 Document Reviewed: 07/18/2012 Elsevier Interactive Patient Education [employer]2016 [employer]

## 2015-06-02 NOTE — Progress Notes (Signed)
   BP 122/80 mmHg  Pulse 59  Temp(Src) 97.9 F (36.6 C) (Oral)  Wt 301 lb 8 oz (136.76 kg)  SpO2 93%   CC: tick bite  Subjective:    Patient ID: Clois Dupesonald Gerald Narain, male    DOB: 16-Feb-1961, 55 y.o.   MRN: [id number]  HPI: Clois DupesDonald Gerald Bellot is a 55 y.o. male presenting on 06/02/2015 for Tick bite follow up   Tick bite 05/26/2015. Tick removed in its entirety. Unsure how long it was embedded, at least 2 days. Had headache and fatigue a few days prior to tick bite. No further symptoms since. Has had several tick bites in the past. Has been using neosporin to site.  No fevers/chills, abd pain, nausea, joint pains. No new rashes. Mild headache - see above.   Mild vague L arm discomfort present for last 2-3 months. Worse pain with laying on left shoulder at night time. No trouble with exertion. No neck pain. Denies inciting trauma/falls.   Has CPE scheduled for June.   Relevant past medical, surgical, family and social history reviewed and updated as indicated. Interim medical history since our last visit reviewed. Allergies and medications reviewed and updated. Current Outpatient Prescriptions on File Prior to Visit  Medication Sig  . ibuprofen (ADVIL,MOTRIN) 200 MG tablet Take 400 mg by mouth every 6 (six) hours as needed.   No current facility-administered medications on file prior to visit.    Review of Systems Per HPI unless specifically indicated in ROS section     Objective:    BP 122/80 mmHg  Pulse 59  Temp(Src) 97.9 F (36.6 C) (Oral)  Wt 301 lb 8 oz (136.76 kg)  SpO2 93%  Wt Readings from Last 3 Encounters:  06/02/15 301 lb 8 oz (136.76 kg)  09/18/12 286 lb (129.729 kg)  10/17/11 291 lb 4 oz (132.11 kg)    Physical Exam  Constitutional: He appears well-developed and well-nourished. No distress.  Musculoskeletal: He exhibits no edema.  L shoulder WNL R Shoulder exam: No deformity of shoulders on inspection. No pain with palpation of shoulder landmarks. FROM in  abduction and forward flexion. No pain or weakness with testing SITS in ext/int rotation. Mild discomfort/weakness with empty can sign on left. Neg Yerguson, Speed test. No pain with rotation of humeral head in GH joint.   Skin: Skin is warm and dry. Rash noted.  Left popliteal area with small scab at site of tick bite, mild papular rash surrouding site, mild induration at bite site  Nursing note and vitals reviewed.      Assessment & Plan:   Problem List Items Addressed This Visit    Tick bite of left lower leg - Primary    No signs of tick borne illness. Anticipate mild papular rash around tick bite site is local reaction to neosporin. rec only use vaseline. Red flags to seek urgent care discussed, as well as sxs indicative of needing abx course - pt will call us if any of these develop.      Left shoulder pain    Anticipate L shoulder RTC injury, ?supraspinatus tendinopathy. Treat with rest and provided with SM pt advisor exercises on RTC injury. Update if not improving with treatment.           Follow up plan: Return if symptoms worsen or fail to improve.  Eustaquio BoydenJavier Rachard Isidro, MD

## 2015-06-02 NOTE — Assessment & Plan Note (Signed)
Anticipate L shoulder RTC injury, ?supraspinatus tendinopathy. Treat with rest and provided with SM pt advisor exercises on RTC injury. Update if not improving with treatment.

## 2015-08-09 ENCOUNTER — Other Ambulatory Visit: Payer: Self-pay | Admitting: Family Medicine

## 2015-08-09 DIAGNOSIS — E538 Deficiency of other specified B group vitamins: Secondary | ICD-10-CM

## 2015-08-09 DIAGNOSIS — Z125 Encounter for screening for malignant neoplasm of prostate: Secondary | ICD-10-CM

## 2015-08-09 DIAGNOSIS — Z1159 Encounter for screening for other viral diseases: Secondary | ICD-10-CM

## 2015-08-09 DIAGNOSIS — R7989 Other specified abnormal findings of blood chemistry: Secondary | ICD-10-CM

## 2015-08-09 DIAGNOSIS — E669 Obesity, unspecified: Secondary | ICD-10-CM

## 2015-08-09 DIAGNOSIS — E0590 Thyrotoxicosis, unspecified without thyrotoxic crisis or storm: Secondary | ICD-10-CM

## 2015-08-11 ENCOUNTER — Other Ambulatory Visit (INDEPENDENT_AMBULATORY_CARE_PROVIDER_SITE_OTHER): Payer: BLUE CROSS/BLUE SHIELD

## 2015-08-11 DIAGNOSIS — E538 Deficiency of other specified B group vitamins: Secondary | ICD-10-CM

## 2015-08-11 DIAGNOSIS — E0590 Thyrotoxicosis, unspecified without thyrotoxic crisis or storm: Secondary | ICD-10-CM

## 2015-08-11 DIAGNOSIS — Z125 Encounter for screening for malignant neoplasm of prostate: Secondary | ICD-10-CM | POA: Diagnosis not present

## 2015-08-11 DIAGNOSIS — Z1159 Encounter for screening for other viral diseases: Secondary | ICD-10-CM

## 2015-08-11 DIAGNOSIS — E669 Obesity, unspecified: Secondary | ICD-10-CM

## 2015-08-11 LAB — LIPID PANEL
CHOL/HDL RATIO: 5
Cholesterol: 171 mg/dL (ref 0–200)
HDL: 37 mg/dL — AB (ref >39.00)
LDL Cholesterol: 100 mg/dL — ABNORMAL HIGH (ref 0–99)
NONHDL: 133.72
Triglycerides: 169 mg/dL — ABNORMAL HIGH (ref 0.0–149.0)
VLDL: 33.8 mg/dL (ref 0.0–40.0)

## 2015-08-11 LAB — COMPREHENSIVE METABOLIC PANEL
ALK PHOS: 78 U/L (ref 39–117)
ALT: 27 U/L (ref 0–53)
AST: 19 U/L (ref 0–37)
Albumin: 4 g/dL (ref 3.5–5.2)
BUN: 13 mg/dL (ref 6–23)
CHLORIDE: 104 meq/L (ref 96–112)
CO2: 30 meq/L (ref 19–32)
Calcium: 9.7 mg/dL (ref 8.4–10.5)
Creatinine, Ser: 1.29 mg/dL (ref 0.40–1.50)
GFR: 61.86 mL/min (ref >60.00)
GLUCOSE: 93 mg/dL (ref 70–99)
POTASSIUM: 4.5 meq/L (ref 3.5–5.1)
Sodium: 138 mEq/L (ref 135–145)
Total Bilirubin: 0.7 mg/dL (ref 0.2–1.2)
Total Protein: 6.5 g/dL (ref 6.0–8.3)

## 2015-08-11 LAB — CBC WITH DIFFERENTIAL/PLATELET
BASOS PCT: 0.8 % (ref 0.0–3.0)
Basophils Absolute: 0.1 10*3/uL (ref 0.0–0.1)
EOS PCT: 4.1 % (ref 0.0–5.0)
Eosinophils Absolute: 0.3 10*3/uL (ref 0.0–0.7)
HCT: 45.6 % (ref 39.0–52.0)
HEMOGLOBIN: 15.5 g/dL (ref 13.0–17.0)
Lymphocytes Relative: 21.4 % (ref 12.0–46.0)
Lymphs Abs: 1.5 10*3/uL (ref 0.7–4.0)
MCHC: 34 g/dL (ref 30.0–36.0)
MCV: 86.7 fl (ref 78.0–100.0)
MONO ABS: 0.7 10*3/uL (ref 0.1–1.0)
Monocytes Relative: 9.3 % (ref 3.0–12.0)
NEUTROS PCT: 64.4 % (ref 43.0–77.0)
Neutro Abs: 4.6 10*3/uL (ref 1.4–7.7)
Platelets: 299 10*3/uL (ref 150.0–400.0)
RBC: 5.26 Mil/uL (ref 4.22–5.81)
RDW: 14.8 % (ref 11.5–15.5)
WBC: 7.1 10*3/uL (ref 4.0–10.5)

## 2015-08-11 LAB — T4, FREE: Free T4: 0.83 ng/dL (ref 0.60–1.60)

## 2015-08-11 LAB — PSA: PSA: 0.47 ng/mL (ref 0.10–4.00)

## 2015-08-11 LAB — TSH: TSH: 1.02 u[IU]/mL (ref 0.35–4.50)

## 2015-08-11 LAB — VITAMIN B12: VITAMIN B 12: 268 pg/mL (ref 211–911)

## 2015-08-11 NOTE — Addendum Note (Signed)
Addended by: Alvina ChouWALSH, TERRI J on: 08/11/2015 09:36 AM   Modules accepted: Orders

## 2015-08-12 LAB — HEPATITIS C ANTIBODY: HCV AB: NEGATIVE

## 2015-08-18 ENCOUNTER — Encounter: Payer: Self-pay | Admitting: Family Medicine

## 2015-08-18 ENCOUNTER — Ambulatory Visit (INDEPENDENT_AMBULATORY_CARE_PROVIDER_SITE_OTHER): Payer: BLUE CROSS/BLUE SHIELD | Admitting: Family Medicine

## 2015-08-18 VITALS — BP 116/84 | HR 75 | Temp 98.2°F | Ht 72.5 in | Wt 293.5 lb

## 2015-08-18 DIAGNOSIS — E0590 Thyrotoxicosis, unspecified without thyrotoxic crisis or storm: Secondary | ICD-10-CM

## 2015-08-18 DIAGNOSIS — Z1211 Encounter for screening for malignant neoplasm of colon: Secondary | ICD-10-CM

## 2015-08-18 DIAGNOSIS — M25512 Pain in left shoulder: Secondary | ICD-10-CM

## 2015-08-18 DIAGNOSIS — Z0000 Encounter for general adult medical examination without abnormal findings: Secondary | ICD-10-CM

## 2015-08-18 DIAGNOSIS — E785 Hyperlipidemia, unspecified: Secondary | ICD-10-CM | POA: Insufficient documentation

## 2015-08-18 DIAGNOSIS — IMO0001 Reserved for inherently not codable concepts without codable children: Secondary | ICD-10-CM

## 2015-08-18 MED ORDER — NAPROXEN 500 MG PO TABS: ORAL_TABLET | ORAL | Status: DC

## 2015-08-18 NOTE — Assessment & Plan Note (Signed)
Preventative protocols reviewed and updated unless pt declined. Discussed healthy diet and lifestyle.  

## 2015-08-18 NOTE — Assessment & Plan Note (Signed)
Stable TFTs.  

## 2015-08-18 NOTE — Assessment & Plan Note (Signed)
Discussed healthy diet and lifestyle changes to affect sustainable weight loss  

## 2015-08-18 NOTE — Progress Notes (Signed)
BP 116/84 mmHg  Pulse 75  Temp(Src) 98.2 F (36.8 C) (Oral)  Ht 6' 0.5" (1.842 m)  Wt 293 lb 8 oz (133.131 kg)  BMI 39.24 kg/m2  SpO2 97%   CC: CPE  Subjective:    Patient ID: Martin Herring, male    DOB: Jun 15, 1960, 55 y.o.   MRN: [id number]  HPI: Martin Herring is a 55 y.o. male presenting on 08/18/2015 for Annual Exam   L shoulder some better. Worse pain with laying on left side at night. Taking aleve prn.   Preventative: Colon screen - discussed, would like stool kit.  Prostate screen - discussed. PSA reassuring. Declines DRE today.  Flu - not done.  Tetanus - 2011, stitches to hand.  Seat belt - 100% use discussed (doesn't do)  Sunscreen use discussed.No changing moles on skin.   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 Interim medical history since our last visit reviewed. Allergies and medications reviewed and updated. No current outpatient prescriptions on file prior to visit.   No current facility-administered medications on file prior to visit.    Review of Systems  Constitutional: Negative for fever, chills, activity change, appetite change, fatigue and unexpected weight change.  HENT: Negative for hearing loss.   Eyes: Negative for visual disturbance.  Respiratory: Positive for shortness of breath (deconditioning). Negative for cough, chest tightness and wheezing.   Cardiovascular: Negative for chest pain, palpitations and leg swelling.  Gastrointestinal: Negative for nausea, vomiting, abdominal pain, diarrhea, constipation, blood in stool and abdominal distention.  Genitourinary: Negative for hematuria and difficulty urinating.  Musculoskeletal: Negative for myalgias, arthralgias and  neck pain.  Skin: Negative for rash.  Neurological: Negative for dizziness, seizures, syncope and headaches.  Hematological: Negative for adenopathy. Does not bruise/bleed easily.  Psychiatric/Behavioral: Negative for dysphoric mood. The patient is not nervous/anxious.    Per HPI unless specifically indicated in ROS section     Objective:    BP 116/84 mmHg  Pulse 75  Temp(Src) 98.2 F (36.8 C) (Oral)  Ht 6' 0.5" (1.842 m)  Wt 293 lb 8 oz (133.131 kg)  BMI 39.24 kg/m2  SpO2 97%  Wt Readings from Last 3 Encounters:  08/18/15 293 lb 8 oz (133.131 kg)  06/02/15 301 lb 8 oz (136.76 kg)  09/18/12 286 lb (129.729 kg)    Physical Exam  Constitutional: He is oriented to person, place, and time. He appears well-developed and well-nourished. No distress.  HENT:  Head: Normocephalic and atraumatic.  Right Ear: Hearing, tympanic membrane, external ear and ear canal normal.  Left Ear: Hearing, tympanic membrane, external ear and ear canal normal.  Nose: Nose normal.  Mouth/Throat: Uvula is midline, oropharynx is clear and moist and mucous membranes are normal. No oropharyngeal exudate, posterior oropharyngeal edema or posterior oropharyngeal erythema.  Eyes: Conjunctivae and EOM are normal. Pupils are equal, round, and reactive to light. No scleral icterus.  Neck: Normal range of motion. Neck supple. No thyromegaly present.  Cardiovascular: Normal rate, regular rhythm, normal heart sounds and intact distal pulses.   No murmur heard. Pulses:      Radial pulses are 2+ on the right side, and 2+ on the left side.  Pulmonary/Chest: Effort normal and breath sounds normal. No respiratory distress.  He has no wheezes. He has no rales.  Abdominal: Soft. Bowel sounds are normal. He exhibits no distension and no mass. There is no tenderness. There is no rebound and no guarding.  Musculoskeletal: Normal range of motion. He exhibits no edema.  Lymphadenopathy:    He has no cervical adenopathy.    Neurological: He is alert and oriented to person, place, and time.  CN grossly intact, station and gait intact  Skin: Skin is warm and dry. No rash noted.  Psychiatric: He has a normal mood and affect. His behavior is normal. Judgment and thought content normal.  Nursing note and vitals reviewed.  Results for orders placed or performed in visit on 08/11/15  Vitamin B12  Result Value Ref Range   Vitamin B-12 268 211 - 911 pg/mL  Comprehensive metabolic panel  Result Value Ref Range   Sodium 138 135 - 145 mEq/L   Potassium 4.5 3.5 - 5.1 mEq/L   Chloride 104 96 - 112 mEq/L   CO2 30 19 - 32 mEq/L   Glucose, Bld 93 70 - 99 mg/dL   BUN 13 6 - 23 mg/dL   Creatinine, Ser 1.29 0.40 - 1.50 mg/dL   Total Bilirubin 0.7 0.2 - 1.2 mg/dL   Alkaline Phosphatase 78 39 - 117 U/L   AST 19 0 - 37 U/L   ALT 27 0 - 53 U/L   Total Protein 6.5 6.0 - 8.3 g/dL   Albumin 4.0 3.5 - 5.2 g/dL   Calcium 9.7 8.4 - 10.5 mg/dL   GFR 61.86 >60.00 mL/min  Lipid panel  Result Value Ref Range   Cholesterol 171 0 - 200 mg/dL   Triglycerides 169.0 (H) 0.0 - 149.0 mg/dL   HDL 37.00 (L) >39.00 mg/dL   VLDL 33.8 0.0 - 40.0 mg/dL   LDL Cholesterol 100 (H) 0 - 99 mg/dL   Total CHOL/HDL Ratio 5    NonHDL 133.72   TSH  Result Value Ref Range   TSH 1.02 0.35 - 4.50 uIU/mL  T4, free  Result Value Ref Range   Free T4 0.83 0.60 - 1.60 ng/dL  Hepatitis C antibody  Result Value Ref Range   HCV Ab NEGATIVE NEGATIVE  PSA  Result Value Ref Range   PSA 0.47 0.10 - 4.00 ng/mL  CBC with Differential/Platelet  Result Value Ref Range   WBC 7.1 4.0 - 10.5 K/uL   RBC 5.26 4.22 - 5.81 Mil/uL   Hemoglobin 15.5 13.0 - 17.0 g/dL   HCT 45.6 39.0 - 52.0 %   MCV 86.7 78.0 - 100.0 fl   MCHC 34.0 30.0 - 36.0 g/dL   RDW 14.8 11.5 - 15.5 %   Platelets 299.0 150.0 - 400.0 K/uL   Neutrophils Relative % 64.4 43.0 - 77.0 %   Lymphocytes Relative 21.4 12.0 - 46.0 %   Monocytes Relative 9.3 3.0 - 12.0 %   Eosinophils Relative 4.1  0.0 - 5.0 %   Basophils Relative 0.8 0.0 - 3.0 %   Neutro Abs 4.6 1.4 - 7.7 K/uL   Lymphs Abs 1.5 0.7 - 4.0 K/uL   Monocytes Absolute 0.7 0.1 - 1.0 K/uL   Eosinophils Absolute 0.3 0.0 - 0.7 K/uL   Basophils Absolute 0.1 0.0 - 0.1 K/uL      Assessment & Plan:   Problem List Items Addressed This Visit    Healthcare maintenance - Primary    Preventative protocols reviewed and updated unless pt declined. Discussed healthy diet and lifestyle.  Obesity, Class II, BMI 35-39.9, with comorbidity (HCC)    Discussed healthy diet and lifestyle changes to affect sustainable weight loss.       Subclinical hyperthyroidism    Stable TFTs      Left shoulder pain    Ongoing pain - will treat with naprosyn course nightly x 1 wk then prn. RTC if not better for steroid injection.       Dyslipidemia    Mild off meds. Discussed diet changes to improve cholesterol levels.  48yrASCVD risk 4.3%        Other Visit Diagnoses    Special screening for malignant neoplasms, colon        Relevant Orders    Ambulatory referral to Gastroenterology        Follow up plan: Return in about 1 year (around 08/17/2016) for annual exam, prior fasting for blood work.  JRia Bush MD

## 2015-08-18 NOTE — Progress Notes (Signed)
Pre visit review using our clinic review tool, if applicable. No additional management support is needed unless otherwise documented below in the visit note. 

## 2015-08-18 NOTE — Assessment & Plan Note (Signed)
Ongoing pain - will treat with naprosyn course nightly x 1 wk then prn. RTC if not better for steroid injection.

## 2015-08-18 NOTE — Assessment & Plan Note (Signed)
Mild off meds. Discussed diet changes to improve cholesterol levels.  7180yr ASCVD risk 4.3%

## 2015-08-18 NOTE — Patient Instructions (Signed)
We will call you to schedule colonoscopy. You are doing well today Return as needed or in 1 year for next physical.  Health Maintenance, Male A healthy lifestyle and preventative care can promote health and wellness.  Maintain regular health, dental, and eye exams.  Eat a healthy diet. Foods like vegetables, fruits, whole grains, low-fat dairy products, and lean protein foods contain the nutrients you need and are low in calories. Decrease your intake of foods high in solid fats, added sugars, and salt. Get information about a proper diet from your health care provider, if necessary.  Regular physical exercise is one of the most important things you can do for your health. Most adults should get at least 150 minutes of moderate-intensity exercise (any activity that increases your heart rate and causes you to sweat) each week. In addition, most adults need muscle-strengthening exercises on 2 or more days a week.   Maintain a healthy weight. The body mass index (BMI) is a screening tool to identify possible weight problems. It provides an estimate of body fat based on height and weight. Your health care provider can find your BMI and can help you achieve or maintain a healthy weight. For males 20 years and older:  A BMI below 18.5 is considered underweight.  A BMI of 18.5 to 24.9 is normal.  A BMI of 25 to 29.9 is considered overweight.  A BMI of 30 and above is considered obese.  Maintain normal blood lipids and cholesterol by exercising and minimizing your intake of saturated fat. Eat a balanced diet with plenty of fruits and vegetables. Blood tests for lipids and cholesterol should begin at age 53 and be repeated every 5 years. If your lipid or cholesterol levels are high, you are over age 53, or you are at high risk for heart disease, you may need your cholesterol levels checked more frequently.Ongoing high lipid and cholesterol levels should be treated with medicines if diet and exercise  are not working.  If you smoke, find out from your health care provider how to quit. If you do not use tobacco, do not start.  Lung cancer screening is recommended for adults aged 55-80 years who are at high risk for developing lung cancer because of a history of smoking. A yearly low-dose CT scan of the lungs is recommended for people who have at least a 30-pack-year history of smoking and are current smokers or have quit within the past 15 years. A pack year of smoking is smoking an average of 1 pack of cigarettes a day for 1 year (for example, a 30-pack-year history of smoking could mean smoking 1 pack a day for 30 years or 2 packs a day for 15 years). Yearly screening should continue until the smoker has stopped smoking for at least 15 years. Yearly screening should be stopped for people who develop a health problem that would prevent them from having lung cancer treatment.  If you choose to drink alcohol, do not have more than 2 drinks per day. One drink is considered to be 12 oz (360 mL) of beer, 5 oz (150 mL) of wine, or 1.5 oz (45 mL) of liquor.  Avoid the use of street drugs. Do not share needles with anyone. Ask for help if you need support or instructions about stopping the use of drugs.  High blood pressure causes heart disease and increases the risk of stroke. High blood pressure is more likely to develop in:  People who have blood pressure in  the end of the normal range (100-139/85-89 mm Hg).  People who are overweight or obese.  People who are African American.  If you are 3-43 years of age, have your blood pressure checked every 3-5 years. If you are 15 years of age or older, have your blood pressure checked every year. You should have your blood pressure measured twice--once when you are at a hospital or clinic, and once when you are not at a hospital or clinic. Record the average of the two measurements. To check your blood pressure when you are not at a hospital or clinic, you  can use:  An automated blood pressure machine at a pharmacy.  A home blood pressure monitor.  If you are 37-25 years old, ask your health care provider if you should take aspirin to prevent heart disease.  Diabetes screening involves taking a blood sample to check your fasting blood sugar level. This should be done once every 3 years after age 84 if you are at a normal weight and without risk factors for diabetes. Testing should be considered at a younger age or be carried out more frequently if you are overweight and have at least 1 risk factor for diabetes.  Colorectal cancer can be detected and often prevented. Most routine colorectal cancer screening begins at the age of 12 and continues through age 54. However, your health care provider may recommend screening at an earlier age if you have risk factors for colon cancer. On a yearly basis, your health care provider may provide home test kits to check for hidden blood in the stool. A small camera at the end of a tube may be used to directly examine the colon (sigmoidoscopy or colonoscopy) to detect the earliest forms of colorectal cancer. Talk to your health care provider about this at age 8 when routine screening begins. A direct exam of the colon should be repeated every 5-10 years through age 85, unless early forms of precancerous polyps or small growths are found.  People who are at an increased risk for hepatitis B should be screened for this virus. You are considered at high risk for hepatitis B if:  You were born in a country where hepatitis B occurs often. Talk with your health care provider about which countries are considered high risk.  Your parents were born in a high-risk country and you have not received a shot to protect against hepatitis B (hepatitis B vaccine).  You have HIV or AIDS.  You use needles to inject street drugs.  You live with, or have sex with, someone who has hepatitis B.  You are a man who has sex with  other men (MSM).  You get hemodialysis treatment.  You take certain medicines for conditions like cancer, organ transplantation, and autoimmune conditions.  Hepatitis C blood testing is recommended for all people born from [birth year] through 1963 and any individual with known risk factors for hepatitis C.  Healthy men should no longer receive prostate-specific antigen (PSA) blood tests as part of routine cancer screening. Talk to your health care provider about prostate cancer screening.  Testicular cancer screening is not recommended for adolescents or adult males who have no symptoms. Screening includes self-exam, a health care provider exam, and other screening tests. Consult with your health care provider about any symptoms you have or any concerns you have about testicular cancer.  Practice safe sex. Use condoms and avoid high-risk sexual practices to reduce the spread of sexually transmitted infections (STIs).  You  should be screened for STIs, including gonorrhea and chlamydia if:  You are sexually active and are younger than 24 years.  You are older than 24 years, and your health care provider tells you that you are at risk for this type of infection.  Your sexual activity has changed since you were last screened, and you are at an increased risk for chlamydia or gonorrhea. Ask your health care provider if you are at risk.  If you are at risk of being infected with HIV, it is recommended that you take a prescription medicine daily to prevent HIV infection. This is called pre-exposure prophylaxis (PrEP). You are considered at risk if:  You are a man who has sex with other men (MSM).  You are a heterosexual man who is sexually active with multiple partners.  You take drugs by injection.  You are sexually active with a partner who has HIV.  Talk with your health care provider about whether you are at high risk of being infected with HIV. If you choose to begin PrEP, you should first be  tested for HIV. You should then be tested every 3 months for as long as you are taking PrEP.  Use sunscreen. Apply sunscreen liberally and repeatedly throughout the day. You should seek shade when your shadow is shorter than you. Protect yourself by wearing long sleeves, pants, a wide-brimmed hat, and sunglasses year round whenever you are outdoors.  Tell your health care provider of new moles or changes in moles, especially if there is a change in shape or color. Also, tell your health care provider if a mole is larger than the size of a pencil eraser.  A one-time screening for abdominal aortic aneurysm (AAA) and surgical repair of large AAAs by ultrasound is recommended for men aged 39-75 years who are current or former smokers.  Stay current with your vaccines (immunizations).   This information is not intended to replace advice given to you by your health care provider. Make sure you discuss any questions you have with your health care provider.   Document Released: 08/06/2007 Document Revised: 02/28/2014 Document Reviewed: 07/05/2010 Elsevier Interactive Patient Education [employer].

## 2015-10-06 ENCOUNTER — Encounter: Payer: Self-pay | Admitting: Family Medicine

## 2018-06-25 ENCOUNTER — Other Ambulatory Visit: Payer: Self-pay | Admitting: Pain Medicine

## 2018-06-25 DIAGNOSIS — M545 Low back pain, unspecified: Secondary | ICD-10-CM | POA: Insufficient documentation

## 2018-06-25 MED ORDER — PREDNISONE 20 MG PO TABS: ORAL_TABLET | ORAL | 0 refills | Status: DC

## 2018-07-18 ENCOUNTER — Other Ambulatory Visit: Payer: Self-pay | Admitting: Pain Medicine

## 2018-07-18 DIAGNOSIS — M545 Low back pain, unspecified: Secondary | ICD-10-CM

## 2018-07-18 MED ORDER — PREDNISONE 20 MG PO TABS: ORAL_TABLET | ORAL | 0 refills | Status: DC

## 2018-07-18 NOTE — Progress Notes (Unsigned)
This is a patient that is known to me and has been having some problems with back pain and leg pain.  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  Today I have order some x-rays on flexion extension of the lumbar spine and I have given him a second prescription for a steroid taper.  The first 1 was for 9 days and this was for 15 days.  The first 1 nearly completely eliminated his pain and this would suggest that we are dealing with an inflammatory process.  However, as soon as he stopped the medication, the pain returned.  Because the patient is known to me, I cannot take him as a patient, but I will refer him to my partner Dr. Edward Jolly.

## 2019-10-25 ENCOUNTER — Other Ambulatory Visit: Payer: Self-pay | Admitting: Pain Medicine

## 2019-10-25 DIAGNOSIS — M545 Low back pain, unspecified: Secondary | ICD-10-CM

## 2019-10-25 MED ORDER — PREDNISONE 20 MG PO TABS: ORAL_TABLET | ORAL | 1 refills | Status: DC

## 2020-04-25 ENCOUNTER — Encounter (HOSPITAL_COMMUNITY): Payer: Self-pay | Admitting: Cardiology

## 2020-04-25 ENCOUNTER — Inpatient Hospital Stay (HOSPITAL_COMMUNITY)
Admission: EM | Admit: 2020-04-25 | Discharge: 2020-04-28 | DRG: 247 | Disposition: A | Discharge status: Home / Self Care | Payer: BLUE CROSS/BLUE SHIELD | Attending: Cardiology | Admitting: Cardiology

## 2020-04-25 ENCOUNTER — Encounter (HOSPITAL_COMMUNITY)
Admission: EM | Disposition: A | Discharge status: Home / Self Care | Payer: Self-pay | Source: Home / Self Care | Attending: Cardiology

## 2020-04-25 ENCOUNTER — Emergency Department (HOSPITAL_COMMUNITY): Admit: 2020-04-25 | Payer: BLUE CROSS/BLUE SHIELD | Admitting: Cardiology

## 2020-04-25 ENCOUNTER — Other Ambulatory Visit: Payer: Self-pay

## 2020-04-25 DIAGNOSIS — I2510 Atherosclerotic heart disease of native coronary artery without angina pectoris: Secondary | ICD-10-CM | POA: Diagnosis present

## 2020-04-25 DIAGNOSIS — Z8249 Family history of ischemic heart disease and other diseases of the circulatory system: Secondary | ICD-10-CM | POA: Diagnosis not present

## 2020-04-25 DIAGNOSIS — Z955 Presence of coronary angioplasty implant and graft: Secondary | ICD-10-CM

## 2020-04-25 DIAGNOSIS — Z880 Allergy status to penicillin: Secondary | ICD-10-CM | POA: Diagnosis not present

## 2020-04-25 DIAGNOSIS — E785 Hyperlipidemia, unspecified: Secondary | ICD-10-CM | POA: Diagnosis present

## 2020-04-25 DIAGNOSIS — E669 Obesity, unspecified: Secondary | ICD-10-CM | POA: Diagnosis present

## 2020-04-25 DIAGNOSIS — N179 Acute kidney failure, unspecified: Secondary | ICD-10-CM | POA: Diagnosis present

## 2020-04-25 DIAGNOSIS — Z79899 Other long term (current) drug therapy: Secondary | ICD-10-CM

## 2020-04-25 DIAGNOSIS — E7800 Pure hypercholesterolemia, unspecified: Secondary | ICD-10-CM | POA: Diagnosis present

## 2020-04-25 DIAGNOSIS — R079 Chest pain, unspecified: Secondary | ICD-10-CM | POA: Diagnosis not present

## 2020-04-25 DIAGNOSIS — D649 Anemia, unspecified: Secondary | ICD-10-CM | POA: Diagnosis present

## 2020-04-25 DIAGNOSIS — I214 Non-ST elevation (NSTEMI) myocardial infarction: Secondary | ICD-10-CM

## 2020-04-25 DIAGNOSIS — I10 Essential (primary) hypertension: Secondary | ICD-10-CM | POA: Diagnosis not present

## 2020-04-25 DIAGNOSIS — I2102 ST elevation (STEMI) myocardial infarction involving left anterior descending coronary artery: Secondary | ICD-10-CM | POA: Diagnosis not present

## 2020-04-25 DIAGNOSIS — Z6834 Body mass index (BMI) 34.0-34.9, adult: Secondary | ICD-10-CM | POA: Diagnosis not present

## 2020-04-25 DIAGNOSIS — N189 Chronic kidney disease, unspecified: Secondary | ICD-10-CM | POA: Diagnosis present

## 2020-04-25 DIAGNOSIS — Z720 Tobacco use: Secondary | ICD-10-CM

## 2020-04-25 DIAGNOSIS — I129 Hypertensive chronic kidney disease with stage 1 through stage 4 chronic kidney disease, or unspecified chronic kidney disease: Secondary | ICD-10-CM | POA: Diagnosis present

## 2020-04-25 DIAGNOSIS — Z20822 Contact with and (suspected) exposure to covid-19: Secondary | ICD-10-CM | POA: Diagnosis present

## 2020-04-25 DIAGNOSIS — I213 ST elevation (STEMI) myocardial infarction of unspecified site: Secondary | ICD-10-CM | POA: Diagnosis present

## 2020-04-25 DIAGNOSIS — N1831 Chronic kidney disease, stage 3a: Secondary | ICD-10-CM | POA: Diagnosis not present

## 2020-04-25 HISTORY — DX: Obesity, unspecified: E66.9

## 2020-04-25 HISTORY — PX: LEFT HEART CATH AND CORONARY ANGIOGRAPHY: CATH118249

## 2020-04-25 HISTORY — PX: CORONARY STENT INTERVENTION: CATH118234

## 2020-04-25 HISTORY — DX: Atherosclerotic heart disease of native coronary artery without angina pectoris: I25.10

## 2020-04-25 LAB — RESP PANEL BY RT-PCR (FLU A&B, COVID) ARPGX2
Influenza A by PCR: NEGATIVE
Influenza B by PCR: NEGATIVE
SARS Coronavirus 2 by RT PCR: NEGATIVE

## 2020-04-25 LAB — CBC
HCT: 45.5 % (ref 39.0–52.0)
Hemoglobin: 15.7 g/dL (ref 13.0–17.0)
MCH: 29.5 pg (ref 26.0–34.0)
MCHC: 34.5 g/dL (ref 30.0–36.0)
MCV: 85.4 fL (ref 80.0–100.0)
Platelets: 286 10*3/uL (ref 150–400)
RBC: 5.33 MIL/uL (ref 4.22–5.81)
RDW: 13.5 % (ref 11.5–15.5)
WBC: 13.5 10*3/uL — ABNORMAL HIGH (ref 4.0–10.5)
nRBC: 0 % (ref 0.0–0.2)

## 2020-04-25 LAB — HIV ANTIBODY (ROUTINE TESTING W REFLEX): HIV Screen 4th Generation wRfx: NONREACTIVE

## 2020-04-25 SURGERY — LEFT HEART CATH AND CORONARY ANGIOGRAPHY
Anesthesia: LOCAL

## 2020-04-25 MED ORDER — HEPARIN SODIUM (PORCINE) 1000 UNIT/ML IJ SOLN
INTRAMUSCULAR | Status: AC
  Filled 2020-04-25: qty 1

## 2020-04-25 MED ORDER — VERAPAMIL HCL 2.5 MG/ML IV SOLN
INTRAVENOUS | Status: DC | PRN
  Administered 2020-04-25: 10 mL via INTRA_ARTERIAL

## 2020-04-25 MED ORDER — SODIUM CHLORIDE 0.9 % IV SOLN
INTRAVENOUS | Status: AC | PRN
Start: 2020-04-25 — End: 2020-04-25
  Administered 2020-04-25: 150 mL/h via INTRAVENOUS

## 2020-04-25 MED ORDER — HYDRALAZINE HCL 20 MG/ML IJ SOLN: 10.0000 mg | INTRAMUSCULAR | Status: AC | PRN

## 2020-04-25 MED ORDER — ONDANSETRON HCL 4 MG/2ML IJ SOLN: 4.0000 mg | Freq: Four times a day (QID) | INTRAMUSCULAR | Status: DC | PRN

## 2020-04-25 MED ORDER — SODIUM CHLORIDE 0.9% FLUSH: 3.0000 mL | INTRAVENOUS | Status: DC | PRN

## 2020-04-25 MED ORDER — VERAPAMIL HCL 2.5 MG/ML IV SOLN
INTRAVENOUS | Status: AC
  Filled 2020-04-25: qty 2

## 2020-04-25 MED ORDER — ASPIRIN EC 81 MG PO TBEC
81.0000 mg | DELAYED_RELEASE_TABLET | Freq: Every day | ORAL | Status: DC
  Administered 2020-04-26 – 2020-04-28 (×3): 81 mg via ORAL
  Filled 2020-04-25 (×3): qty 1

## 2020-04-25 MED ORDER — ATORVASTATIN CALCIUM 80 MG PO TABS
80.0000 mg | ORAL_TABLET | Freq: Every day | ORAL | Status: DC
  Administered 2020-04-25 – 2020-04-28 (×4): 80 mg via ORAL
  Filled 2020-04-25 (×4): qty 1

## 2020-04-25 MED ORDER — ACETAMINOPHEN 325 MG PO TABS: 650.0000 mg | ORAL_TABLET | ORAL | Status: DC | PRN

## 2020-04-25 MED ORDER — HEPARIN (PORCINE) IN NACL 1000-0.9 UT/500ML-% IV SOLN
INTRAVENOUS | Status: AC
  Filled 2020-04-25: qty 1000

## 2020-04-25 MED ORDER — SODIUM CHLORIDE 0.9% FLUSH
3.0000 mL | Freq: Two times a day (BID) | INTRAVENOUS | Status: DC
  Administered 2020-04-25 – 2020-04-28 (×5): 3 mL via INTRAVENOUS

## 2020-04-25 MED ORDER — LABETALOL HCL 5 MG/ML IV SOLN: 10.0000 mg | INTRAVENOUS | Status: AC | PRN

## 2020-04-25 MED ORDER — NITROGLYCERIN 1 MG/10 ML FOR IR/CATH LAB
INTRA_ARTERIAL | Status: DC | PRN
  Administered 2020-04-25 (×2): 200 ug via INTRACORONARY

## 2020-04-25 MED ORDER — LIDOCAINE HCL (PF) 1 % IJ SOLN
INTRAMUSCULAR | Status: AC
  Filled 2020-04-25: qty 30

## 2020-04-25 MED ORDER — TICAGRELOR 90 MG PO TABS
ORAL_TABLET | ORAL | Status: DC | PRN
  Administered 2020-04-25: 180 mg via ORAL

## 2020-04-25 MED ORDER — LIDOCAINE HCL (PF) 1 % IJ SOLN
INTRAMUSCULAR | Status: DC | PRN
  Administered 2020-04-25: 2 mL via SUBCUTANEOUS

## 2020-04-25 MED ORDER — HEPARIN SODIUM (PORCINE) 5000 UNIT/ML IJ SOLN: 5000.0000 [IU] | Freq: Three times a day (TID) | INTRAMUSCULAR | Status: DC

## 2020-04-25 MED ORDER — SODIUM CHLORIDE 0.9 % IV SOLN
250.0000 mL | INTRAVENOUS | Status: DC | PRN
  Administered 2020-04-25: 250 mL via INTRAVENOUS

## 2020-04-25 MED ORDER — ASPIRIN 81 MG PO CHEW
324.0000 mg | CHEWABLE_TABLET | ORAL | Status: AC
Start: 2020-04-25 — End: 2020-04-26
  Filled 2020-04-25: qty 4

## 2020-04-25 MED ORDER — ASPIRIN 300 MG RE SUPP: 300.0000 mg | RECTAL | Status: AC

## 2020-04-25 MED ORDER — HEPARIN (PORCINE) IN NACL 1000-0.9 UT/500ML-% IV SOLN
INTRAVENOUS | Status: DC | PRN
  Administered 2020-04-25 (×2): 500 mL

## 2020-04-25 MED ORDER — NITROGLYCERIN 0.4 MG SL SUBL: 0.4000 mg | SUBLINGUAL_TABLET | SUBLINGUAL | Status: DC | PRN

## 2020-04-25 MED ORDER — HEPARIN (PORCINE) IN NACL 1000-0.9 UT/500ML-% IV SOLN
INTRAVENOUS | Status: DC | PRN
  Administered 2020-04-25 (×3): 500 mL

## 2020-04-25 MED ORDER — NITROGLYCERIN IN D5W 200-5 MCG/ML-% IV SOLN
INTRAVENOUS | Status: AC | PRN
Start: 2020-04-25 — End: 2020-04-25
  Administered 2020-04-25: 10 ug/min via INTRAVENOUS

## 2020-04-25 MED ORDER — FUROSEMIDE 10 MG/ML IJ SOLN
40.0000 mg | Freq: Once | INTRAMUSCULAR | Status: AC
  Administered 2020-04-25: 40 mg via INTRAVENOUS
  Filled 2020-04-25: qty 4

## 2020-04-25 MED ORDER — NITROGLYCERIN IN D5W 200-5 MCG/ML-% IV SOLN
0.0000 ug/min | INTRAVENOUS | Status: DC
  Administered 2020-04-25: 10 ug/min via INTRAVENOUS

## 2020-04-25 MED ORDER — IOHEXOL 350 MG/ML SOLN
INTRAVENOUS | Status: AC
  Filled 2020-04-25: qty 1

## 2020-04-25 MED ORDER — CARVEDILOL 6.25 MG PO TABS
6.2500 mg | ORAL_TABLET | Freq: Two times a day (BID) | ORAL | Status: DC
  Administered 2020-04-26 – 2020-04-28 (×5): 6.25 mg via ORAL
  Filled 2020-04-25 (×5): qty 1

## 2020-04-25 MED ORDER — HEPARIN SODIUM (PORCINE) 5000 UNIT/ML IJ SOLN
5000.0000 [IU] | Freq: Three times a day (TID) | INTRAMUSCULAR | Status: DC
  Administered 2020-04-26 – 2020-04-28 (×7): 5000 [IU] via SUBCUTANEOUS
  Filled 2020-04-25 (×7): qty 1

## 2020-04-25 MED ORDER — TICAGRELOR 90 MG PO TABS
ORAL_TABLET | ORAL | Status: AC
  Filled 2020-04-25: qty 2

## 2020-04-25 MED ORDER — HEPARIN SODIUM (PORCINE) 1000 UNIT/ML IJ SOLN
INTRAMUSCULAR | Status: DC | PRN
  Administered 2020-04-25: 12000 [IU] via INTRAVENOUS

## 2020-04-25 MED ORDER — TICAGRELOR 90 MG PO TABS
90.0000 mg | ORAL_TABLET | Freq: Two times a day (BID) | ORAL | Status: DC
  Administered 2020-04-26 – 2020-04-28 (×5): 90 mg via ORAL
  Filled 2020-04-25 (×5): qty 1

## 2020-04-25 SURGICAL SUPPLY — 21 items
BALLN SAPPHIRE 2.5X12 (BALLOONS) ×2
BALLN SAPPHIRE ~~LOC~~ 3.0X12 (BALLOONS) ×1 IMPLANT
BALLN ~~LOC~~ EMERGE MR 3.5X12 (BALLOONS) ×2
BALLOON SAPPHIRE 2.5X12 (BALLOONS) IMPLANT
BALLOON ~~LOC~~ EMERGE MR 3.5X12 (BALLOONS) IMPLANT
CATH 5FR JL3.5 JR4 ANG PIG MP (CATHETERS) ×1 IMPLANT
CATH VISTA GUIDE 6FR XBLAD3.5 (CATHETERS) ×1 IMPLANT
DEVICE RAD COMP TR BAND LRG (VASCULAR PRODUCTS) ×1 IMPLANT
GLIDESHEATH SLEND SS 6F .021 (SHEATH) ×1 IMPLANT
GUIDEWIRE INQWIRE 1.5J.035X260 (WIRE) IMPLANT
INQWIRE 1.5J .035X260CM (WIRE) ×2
KIT ENCORE 26 ADVANTAGE (KITS) ×2 IMPLANT
KIT HEART LEFT (KITS) ×2 IMPLANT
PACK CARDIAC CATHETERIZATION (CUSTOM PROCEDURE TRAY) ×2 IMPLANT
SHEATH PROBE COVER 6X72 (BAG) ×1 IMPLANT
STENT RESOLUTE ONYX 3.0X18 (Permanent Stent) ×1 IMPLANT
STENT RESOLUTE ONYX 3.5X22 (Permanent Stent) ×1 IMPLANT
TRANSDUCER W/STOPCOCK (MISCELLANEOUS) ×2 IMPLANT
TUBING CIL FLEX 10 FLL-RA (TUBING) ×2 IMPLANT
WIRE ASAHI PROWATER 180CM (WIRE) ×2 IMPLANT
WIRE COUGAR XT STRL 190CM (WIRE) ×1 IMPLANT

## 2020-04-25 NOTE — Plan of Care (Signed)

## 2020-04-25 NOTE — Progress Notes (Signed)
   04/25/20 1800  Clinical Encounter Type  Visited With Patient not available  Visit Type ED  Referral From Nurse  Consult/Referral To Chaplain  Chaplain responded to code stemi to ED.  Patient is not available. The Chaplain remains available if needed. This note was prepared by Deneen Harts, M.Div..  For questions please contact by phone [phone].

## 2020-04-25 NOTE — H&P (Signed)
Cardiology Admission History and Physical:   Patient ID: Martin Herring MRN: [id number]; DOB: December 28, 1960   Admission date: 04/25/2020  PCP:  Eustaquio Boyden, MD   [facility]  Cardiologist:  No primary care provider on file. New Advanced Practice Provider:  No care team member to display Electrophysiologist:  None      Chief Complaint:  Chest pain  Patient Profile:   Martin Herring is a 60 y.o. male with no prior cardiac history. He is transferred from [facility] ED with NSTEMI and ongoing chest pain.   History of Present Illness:   Mr. Arata has a history of HLD. He reports a 3 week history of progressive chest pain. Initially this was intermittent. Over the past week it has been constant. Pain is mid sternal radiating to the right shoulder. It is a pressure and heaviness. No N/V, dyspnea, or sweating. Pain intensified and so he decided to go to the ED. There Ecg showed anterior T wave inversion. There were some septal ST changes but did not meet STEMI criteria. Troponin was elevated at 7000. Despite medical management he continued to have chest pain so was transferred for urgent cardiac cath. He does have family history of early CAD in his father and an aunt.    Past Medical History:  Diagnosis Date  . Glaucoma    No treatment currently; under close observation  . History of chicken pox   . Obesity   . Obesity (BMI 35.0-39.9 without comorbidity) 04/25/2020    No past surgical history on file.   Medications Prior to Admission: Prior to Admission medications   Medication Sig Start Date End Date Taking? Authorizing Provider  naproxen (NAPROSYN) 500 MG tablet Take one nightly x 1 week then prn pain, take with food 08/18/15   Eustaquio Boyden, MD  predniSONE (DELTASONE) 20 MG tablet Take 3 tablets (60 mg total) by mouth daily with breakfast for 5 days, THEN 2 tablets (40 mg total) daily with breakfast for 5 days, THEN 1 tablet (20 mg total) daily  with breakfast for 5 days. 10/25/19 11/09/19  Delano Metz, MD     Allergies:    Allergies  Allergen Reactions  . Penicillins Other (See Comments)    Unknown reaction; was told as a child he had a reaction    Social History:   Social History   Socioeconomic History  . Marital status: Married    Spouse name: Not on file  . Number of children: Not on file  . Years of education: Not on file  . Highest education level: Not on file  Occupational History  . Not on file  Tobacco Use  . Smoking status: Never Smoker  . Smokeless tobacco: Current User    Types: Chew  . Tobacco comment: less then 1/2 ppd chewing  Substance and Sexual Activity  . Alcohol use: No    Alcohol/week: 0.0 standard drinks  . Drug use: No  . Sexual activity: Not on file  Other Topics Concern  . 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   Diet: good amt water, fruits/vegetables daily   Social Determinants of Corporate investment banker Strain: Not on file  Food Insecurity: Not on file  Transportation Needs: Not on file  Physical Activity: Not on file  Stress: Not on file  Social Connections: Not on file  Intimate Partner Violence: Not on file    Family History:   The patient's family history includes Cancer in his paternal uncle; Coronary artery disease (age of onset: 25) in his paternal grandmother; Diabetes in his paternal uncle; Stroke (age of onset: 61) in his father; Stroke (age of onset: 65) in his paternal grandmother.    ROS:  Please see the history of present illness.  All other ROS reviewed and negative.     Physical Exam/Data:   Vitals:   04/25/20 1805 04/25/20 1810 04/25/20 1815 04/25/20 1819  BP: (!) 175/110 (!) 178/111 (!) 176/103 (!) 186/111  Pulse: 66 62 65 61  Resp: (!) 0 10 12 14   SpO2: 99% 100% 100% 99%   Weight:      Height:       No intake or output data in the 24 hours ending 04/25/20 1853 Last 3 Weights 04/25/2020 08/18/2015 06/02/2015  Weight (lbs) 275 lb 9.2 oz 293 lb 8 oz 301 lb 8 oz  Weight (kg) 125 kg 133.131 kg 136.76 kg     Body mass index is 35.38 kg/m.  General:  Well nourished, obese, in no acute distress HEENT: normal Lymph: no adenopathy Neck: no JVD Endocrine:  No thryomegaly Vascular: No carotid bruits; FA pulses 2+ bilaterally without bruits  Cardiac:  normal S1, S2; RRR; no murmur  Lungs:  clear to auscultation bilaterally, no wheezing, rhonchi or rales  Abd: soft, nontender, no hepatomegaly  Ext: no edema Musculoskeletal:  No deformities, BUE and BLE strength normal and equal Skin: warm and dry  Neuro:  CNs 2-12 intact, no focal abnormalities noted Psych:  Normal affect    EKG:  The ECG that was done today was personally reviewed and demonstrates NSR with T wave inversion c/w anterior ischemia. I have personally reviewed and interpreted this study.   Relevant CV Studies: none  Laboratory Data:  High Sensitivity Troponin:  No results for input(s): TROPONINIHS in the last 720 hours.    ChemistryNo results for input(s): NA, K, CL, CO2, GLUCOSE, BUN, CREATININE, CALCIUM, GFRNONAA, GFRAA, ANIONGAP in the last 168 hours.  No results for input(s): PROT, ALBUMIN, AST, ALT, ALKPHOS, BILITOT in the last 168 hours. HematologyNo results for input(s): WBC, RBC, HGB, HCT, MCV, MCH, MCHC, RDW, PLT in the last 168 hours. BNPNo results for input(s): BNP, PROBNP in the last 168 hours.  DDimer No results for input(s): DDIMER in the last 168 hours.   Radiology/Studies:  CARDIAC CATHETERIZATION  Result Date: 04/25/2020  Prox LAD lesion is 95% stenosed.  Prox LAD to Mid LAD lesion is 60% stenosed.  A drug-eluting stent was successfully placed using a STENT RESOLUTE ONYX 3.5X22.  Post intervention, there is a 0% residual stenosis.  1st Diag lesion is 80% stenosed.  A  drug-eluting stent was successfully placed using a STENT RESOLUTE ONYX 3.0X18.  Post intervention, there is a 0% residual stenosis.  Post intervention, there is a 0% residual stenosis.  There is mild left ventricular systolic dysfunction.  LV end diastolic pressure is moderately elevated.  The left ventricular ejection fraction is 45-50% by visual estimate.  1. 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 Medina class 1,1,1. 2. Mild LV dysfunction with anterior HK 3. Moderately elevated LVEDP 4. Successful PCI of the LAD/diagonal bifurcation with DES x 2 in a Culotte technique. Plan: DAPT for one year at least.  Risk factor modification. Patient is still hypertensive so IV Ntg continued post procedure.     Assessment and Plan:   1. NSTEMI. Ongoing chest pain. Troponin elevated initially to 7000. Given IV heparin, metoprolol, ASA and sl Ntg without relief. Recommend urgent cardiac cath with possible PCI. 2. HTN severe - not previously diagnosed. BP 180/100 on arrival. Will initiate IV Ntg and start beta blocker. Titrate therapy as required. 3. Hypercholesterolemia. Start high dose statin.  4. Acute kidney failure of unknown chronicity. Creatinine 1.62. will follow 5. Anemia. Hgb 10. Chronicity unknown. Will plan to heme check stool. Check iron studies.    Risk Assessment/Risk Scores:     TIMI Risk Score for Unstable Angina or Non-ST Elevation MI:   The patient's TIMI risk score is 2, which indicates a 8% risk of all cause mortality, new or recurrent myocardial infarction or need for urgent revascularization in the next 14 days.       Severity of Illness: The appropriate patient status for this patient is INPATIENT. Inpatient status is judged to be reasonable and necessary in order to provide the required intensity of service to ensure the patient's safety. The patient's presenting symptoms, physical exam findings, and  initial radiographic and laboratory data in the context of their chronic comorbidities is felt to place them at high risk for further clinical deterioration. Furthermore, it is not anticipated that the patient will be medically stable for discharge from the hospital within 2 midnights of admission. The following factors support the patient status of inpatient.   " The patient's presenting symptoms include chest pain. " The worrisome physical exam findings include severe elevation of BP. " The initial radiographic and laboratory data are worrisome because of abnormal Ecg, elevated troponin and BNP. " The chronic co-morbidities include HLD.   * I certify that at the point of admission it is my clinical judgment that the patient will require inpatient hospital care spanning beyond 2 midnights from the point of admission due to high intensity of service, high risk for further deterioration and high frequency of surveillance required.*    For questions or updates, please contact [facility] Please consult [email or web address] for contact info under     Signed, Madisson Kulaga [place], MD  04/25/2020 6:53 PM

## 2020-04-25 NOTE — H&P (Incomplete)
Cardiology Admission History and Physical:   Patient ID: Martin Herring MRN: [id number]; DOB: 1960/10/22   Admission date: (Not on file)  PCP:  Eustaquio Boyden, MD   [facility]  Cardiologist:  Dr. Peter [place], MD  Chief Complaint:  STEMI  Patient Profile:   Martin Herring is a 60 y.o. male with a history of obesity with no prior history of CAD who presented to [facility] with a 2-day history of chest pain found to have a STEMI who was subsequently transferred to [facility] for emergent cardiac catheterization.  History of Present Illness:   Mr. Martin Herring is a 60 year old male with no significant past medical history who presented to [facility] with a 2-day history of chest pain.  EKG at presentation showed ST elevations in leads V2-V3.  High-sensitivity troponin found to be 7600.  Given this, he was transferred to [facility] for emergent cardiac catheterization.  Of note he has a strong family history of CAD in his father and younger brother.  Past Medical History:  Diagnosis Date  . Glaucoma    No treatment currently; under close observation  . History of chicken pox   . Obesity   . Obesity (BMI 35.0-39.9 without comorbidity) 04/25/2020    No past surgical history on file.   Medications Prior to Admission: Prior to Admission medications   Medication Sig Start Date End Date Taking? Authorizing Provider  naproxen (NAPROSYN) 500 MG tablet Take one nightly x 1 week then prn pain, take with food 08/18/15   Eustaquio Boyden, MD  predniSONE (DELTASONE) 20 MG tablet Take 3 tablets (60 mg total) by mouth daily with breakfast for 5 days, THEN 2 tablets (40 mg total) daily with breakfast for 5 days, THEN 1 tablet (20 mg total) daily with breakfast for 5 days. 10/25/19 11/09/19  Delano Metz, MD     Allergies:    Allergies  Allergen Reactions  . Penicillins Other (See Comments)    Unknown reaction; was told as a child he had a reaction     Social History:   Social History   Socioeconomic History  . Marital status: Married    Spouse name: Not on file  . Number of children: Not on file  . Years of education: Not on file  . Highest education level: Not on file  Occupational History  . Not on file  Tobacco Use  . Smoking status: Never Smoker  . Smokeless tobacco: Current User    Types: Chew  . Tobacco comment: less then 1/2 ppd chewing  Substance and Sexual Activity  . Alcohol use: No    Alcohol/week: 0.0 standard drinks  . Drug use: No  . Sexual activity: Not on file  Other Topics Concern  . 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   Diet: good amt water, fruits/vegetables daily   Social Determinants of Corporate investment banker Strain: Not on Ship broker Insecurity: Not on file  Transportation Needs: Not on file  Physical Activity: Not on file  Stress: Not on file  Social Connections: Not on file  Intimate Partner Violence: Not on file    Family History:   The patient's family history includes Cancer in his paternal uncle; Coronary artery disease (age of onset: 60) in his paternal  grandmother; Diabetes in his paternal uncle; Stroke (age of onset: 80) in his father; Stroke (age of onset: 57) in his paternal grandmother.    ROS:  Please see the history of present illness.  All other ROS reviewed and negative.     Physical Exam/Data:  There were no vitals filed for this visit. No intake or output data in the 24 hours ending 04/25/20 1656 Last 3 Weights 08/18/2015 06/02/2015 09/18/2012  Weight (lbs) 293 lb 8 oz 301 lb 8 oz 286 lb  Weight (kg) 133.131 kg 136.76 kg 129.729 kg     There is no height or weight on file to calculate BMI.    General:  Well nourished, well developed, in no acute distress*** HEENT: normal Lymph: no  adenopathy Neck: no*** JVD Endocrine:  No thryomegaly Vascular: No carotid bruits; FA pulses 2+ bilaterally without bruits  Cardiac:  normal S1, S2; RRR; no murmur *** Lungs:  clear to auscultation bilaterally, no wheezing, rhonchi or rales  Abd: soft, nontender, no hepatomegaly  Ext: no*** edema Musculoskeletal:  No deformities, BUE and BLE strength normal and equal Skin: warm and dry  Neuro:  CNs 2-12 intact, no focal abnormalities noted Psych:  Normal affect   EKG:  The ECG that was done *** was personally reviewed and demonstrates ***  Relevant CV Studies:   Pending LHC 04/25/2020  Laboratory Data:  High Sensitivity Troponin:  No results for input(s): TROPONINIHS in the last 720 hours.    ChemistryNo results for input(s): NA, K, CL, CO2, GLUCOSE, BUN, CREATININE, CALCIUM, GFRNONAA, GFRAA, ANIONGAP in the last 168 hours.  No results for input(s): PROT, ALBUMIN, AST, ALT, ALKPHOS, BILITOT in the last 168 hours. HematologyNo results for input(s): WBC, RBC, HGB, HCT, MCV, MCH, MCHC, RDW, PLT in the last 168 hours. BNPNo results for input(s): BNP, PROBNP in the last 168 hours.  DDimer No results for input(s): DDIMER in the last 168 hours.   Radiology/Studies:  No results found.   Assessment and Plan:   1.  STEMI: -Patient presented with a 2-day history of persistent chest pain who presented to [facility] found to have high-sensitivity troponin greater than 7600 and ST elevations in leads V2-V3 on EKG.  Given this patient transferred to [facility] for emergent cardiac catheterization. -Plan to risk stratify with lipid panel, hemoglobin A1c -Orders to follow post cath per Dr. [place]   Risk Assessment/Risk Scores:  {  Severity of Illness: The appropriate patient status for this patient is INPATIENT. Inpatient status is judged to be reasonable and necessary in order to provide the required intensity of service to ensure the patient's safety. The patient's presenting  symptoms, physical exam findings, and initial radiographic and laboratory data in the context of their chronic comorbidities is felt to place them at high risk for further clinical deterioration. Furthermore, it is not anticipated that the patient will be medically stable for discharge from the hospital within 2 midnights of admission. The following factors support the patient status of inpatient.   " The patient's presenting symptoms include chest pain. " The worrisome physical exam findings include STEMI. " The initial radiographic and laboratory data are worrisome because of elevated troponin. " The chronic co-morbidities include obesity.   * I certify that at the point of admission it is my clinical judgment that the patient will require inpatient hospital care spanning beyond 2 midnights from the point of admission due to high intensity of service, high risk for further deterioration and high frequency of  surveillance required.*    For questions or updates, please contact [facility] Please consult [email or web address] for contact info under     Signed, Georgie Chard, NP  04/25/2020 4:56 PM

## 2020-04-26 ENCOUNTER — Inpatient Hospital Stay (HOSPITAL_COMMUNITY): Payer: BLUE CROSS/BLUE SHIELD

## 2020-04-26 DIAGNOSIS — R079 Chest pain, unspecified: Secondary | ICD-10-CM | POA: Diagnosis not present

## 2020-04-26 DIAGNOSIS — I2102 ST elevation (STEMI) myocardial infarction involving left anterior descending coronary artery: Secondary | ICD-10-CM

## 2020-04-26 DIAGNOSIS — N1831 Chronic kidney disease, stage 3a: Secondary | ICD-10-CM

## 2020-04-26 DIAGNOSIS — I10 Essential (primary) hypertension: Secondary | ICD-10-CM | POA: Diagnosis not present

## 2020-04-26 LAB — BASIC METABOLIC PANEL
Anion gap: 11 (ref 5–15)
BUN: 15 mg/dL (ref 6–20)
CO2: 24 mmol/L (ref 22–32)
Calcium: 9.4 mg/dL (ref 8.9–10.3)
Chloride: 101 mmol/L (ref 98–111)
Creatinine, Ser: 1.52 mg/dL — ABNORMAL HIGH (ref 0.61–1.24)
GFR, Estimated: 53 mL/min — ABNORMAL LOW (ref >60)
Glucose, Bld: 118 mg/dL — ABNORMAL HIGH (ref 70–99)
Potassium: 3.5 mmol/L (ref 3.5–5.1)
Sodium: 136 mmol/L (ref 135–145)

## 2020-04-26 LAB — CBC
HCT: 43.4 % (ref 39.0–52.0)
Hemoglobin: 15.5 g/dL (ref 13.0–17.0)
MCH: 30.1 pg (ref 26.0–34.0)
MCHC: 35.7 g/dL (ref 30.0–36.0)
MCV: 84.3 fL (ref 80.0–100.0)
Platelets: 279 10*3/uL (ref 150–400)
RBC: 5.15 MIL/uL (ref 4.22–5.81)
RDW: 13.5 % (ref 11.5–15.5)
WBC: 11 10*3/uL — ABNORMAL HIGH (ref 4.0–10.5)
nRBC: 0 % (ref 0.0–0.2)

## 2020-04-26 LAB — ECHOCARDIOGRAM COMPLETE
AR max vel: 2.44 cm2
AV Area VTI: 2.39 cm2
AV Area mean vel: 2.65 cm2
AV Mean grad: 4 mmHg
AV Peak grad: 7.5 mmHg
Ao pk vel: 1.37 m/s
Area-P 1/2: 3.48 cm2
Height: 74 in
MV VTI: 2.67 cm2
S' Lateral: 2.6 cm
Weight: 4356.8 oz

## 2020-04-26 LAB — MRSA PCR SCREENING: MRSA by PCR: NEGATIVE

## 2020-04-26 LAB — LIPID PANEL
Cholesterol: 180 mg/dL (ref 0–200)
HDL: 45 mg/dL (ref >40)
LDL Cholesterol: 97 mg/dL (ref 0–99)
Total CHOL/HDL Ratio: 4 RATIO
Triglycerides: 188 mg/dL — ABNORMAL HIGH (ref <150)
VLDL: 38 mg/dL (ref 0–40)

## 2020-04-26 LAB — TROPONIN I (HIGH SENSITIVITY): Troponin I (High Sensitivity): 24246 ng/L (ref <18)

## 2020-04-26 LAB — CREATININE, SERUM
Creatinine, Ser: 1.55 mg/dL — ABNORMAL HIGH (ref 0.61–1.24)
GFR, Estimated: 52 mL/min — ABNORMAL LOW (ref >60)

## 2020-04-26 MED ORDER — CHLORHEXIDINE GLUCONATE CLOTH 2 % EX PADS
6.0000 | MEDICATED_PAD | Freq: Every day | CUTANEOUS | Status: DC
  Administered 2020-04-26 – 2020-04-27 (×2): 6 via TOPICAL

## 2020-04-26 NOTE — Progress Notes (Signed)
Date and time results received: 04/26/20 0015  Test: TROPONIN Critical Value: 24,246  Name of Provider Notified: MANUM  Orders Received? Or Actions Taken?: Incorrect lab result called from lab. Updated on correct result. Following previous protocols/orders.

## 2020-04-26 NOTE — Progress Notes (Signed)
Progress Note  Patient Name: Martin Herring Date of Encounter: 04/26/2020  [facility] Cardiologist: No primary care provider on file. New  Subjective   Feeling great.  Walked several laps around the unit without angina or dyspnea.  Inpatient Medications    Scheduled Meds: . aspirin  324 mg Oral NOW   Or  . aspirin  300 mg Rectal NOW  . aspirin EC  81 mg Oral Daily  . atorvastatin  80 mg Oral Daily  . carvedilol  6.25 mg Oral BID WC  . Chlorhexidine Gluconate Cloth  6 each Topical Daily  . heparin  5,000 Units Subcutaneous Q8H  . sodium chloride flush  3 mL Intravenous Q12H  . ticagrelor  90 mg Oral BID   Continuous Infusions: . sodium chloride Stopped (04/25/20 2318)  . nitroGLYCERIN Stopped (04/25/20 2149)   PRN Meds: sodium chloride, acetaminophen, nitroGLYCERIN, ondansetron (ZOFRAN) IV, sodium chloride flush   Vital Signs    Vitals:   04/26/20 0500 04/26/20 0600 04/26/20 0700 04/26/20 0800  BP: 112/73 136/71 (!) 119/91 116/79  Pulse:    68  Resp: 17 15 15 15   Temp:   98.2 F (36.8 C)   TempSrc:      SpO2: 98% 98% 99% 97%  Weight:      Height:        Intake/Output Summary (Last 24 hours) at 04/26/2020 0944 Last data filed at 04/26/2020 0400 Gross per 24 hour  Intake 534.32 ml  Output 3650 ml  Net -3115.68 ml   Last 3 Weights 04/26/2020 04/25/2020 08/18/2015  Weight (lbs) 272 lb 4.8 oz 275 lb 9.2 oz 293 lb 8 oz  Weight (kg) 123.514 kg 125 kg 133.131 kg      Telemetry    Normal sinus rhythm, a single 6 beat run of nonsustained VT- Personally Reviewed  ECG    Normal sinus rhythm, residual ST segment elevation V1-V3 with evolving negative T waves in the anterior septal leads - Personally Reviewed  Physical Exam  Obese GEN: No acute distress.   Neck: No JVD Cardiac: RRR, no murmurs, rubs, or gallops.  Respiratory: Clear to auscultation bilaterally. GI: Soft, nontender, non-distended  MS: No edema; No deformity.  Healthy right radial access site,  excellent pulses, no ecchymosis. Neuro:  Nonfocal  Psych: Normal affect   Labs    High Sensitivity Troponin:   Recent Labs  Lab 04/25/20 2246  TROPONINIHS 24,246*      Chemistry Recent Labs  Lab 04/25/20 2246 04/26/20 0055  NA  --  136  K  --  3.5  CL  --  101  CO2  --  24  GLUCOSE  --  118*  BUN  --  15  CREATININE 1.55* 1.52*  CALCIUM  --  9.4  GFRNONAA 52* 53*  ANIONGAP  --  11     Hematology Recent Labs  Lab 04/25/20 2246 04/26/20 0055  WBC 13.5* 11.0*  RBC 5.33 5.15  HGB 15.7 15.5  HCT 45.5 43.4  MCV 85.4 84.3  MCH 29.5 30.1  MCHC 34.5 35.7  RDW 13.5 13.5  PLT 286 279    BNPNo results for input(s): BNP, PROBNP in the last 168 hours.   DDimer No results for input(s): DDIMER in the last 168 hours.   Radiology    CARDIAC CATHETERIZATION  Result Date: 04/25/2020  Prox LAD lesion is 95% stenosed.  Prox LAD to Mid LAD lesion is 60% stenosed.  A drug-eluting stent was successfully placed using  a STENT RESOLUTE ONYX 3.5X22.  Post intervention, there is a 0% residual stenosis.  1st Diag lesion is 80% stenosed.  A drug-eluting stent was successfully placed using a STENT RESOLUTE ONYX 3.0X18.  Post intervention, there is a 0% residual stenosis.  Post intervention, there is a 0% residual stenosis.  There is mild left ventricular systolic dysfunction.  LV end diastolic pressure is moderately elevated.  The left ventricular ejection fraction is 45-50% by visual estimate.  1. 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 Medina class 1,1,1. 2. Mild LV dysfunction with anterior HK 3. Moderately elevated LVEDP 4. Successful PCI of the LAD/diagonal bifurcation with DES x 2 in a Culotte technique. Plan: DAPT for one year at least. Risk factor modification. Patient is still hypertensive so IV Ntg continued post procedure.    Cardiac Studies   Cardiac cat 04/25/2020   Prox LAD lesion is 95%  stenosed.  Prox LAD to Mid LAD lesion is 60% stenosed.  A drug-eluting stent was successfully placed using a STENT RESOLUTE ONYX 3.5X22.  Post intervention, there is a 0% residual stenosis.  1st Diag lesion is 80% stenosed.  A drug-eluting stent was successfully placed using a STENT RESOLUTE ONYX 3.0X18.  Post intervention, there is a 0% residual stenosis.  Post intervention, there is a 0% residual stenosis.  There is mild left ventricular systolic dysfunction.  LV end diastolic pressure is moderately elevated.  The left ventricular ejection fraction is 45-50% by visual estimate.   1. 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 Medina class 1,1,1. 2. Mild LV dysfunction with anterior HK 3. Moderately elevated LVEDP 4. Successful PCI of the LAD/diagonal bifurcation with DES x 2 in a Culotte technique.  Plan: DAPT for one year at least. Risk factor modification. Patient is still hypertensive so IV Ntg continued post procedure.   Diagnostic Dominance: Right    Intervention     Implants     Stent Resolute Onyx 3.0x18 - [id number] - Implanted  Inventory item: STENT RESOLUTE ONYX 3.0X18 Model/Cat number: [id number]      Stent Resolute Onyx 3.5x22 - [id number] - Implanted  Inventory item: STENT RESOLUTE ONYX 3.5X22     ECHO 04/26/2020  1. Left ventricular ejection fraction, by estimation, is 55 to 60%. The  left ventricle has normal function. The left ventricle demonstrates  regional wall motion abnormalities (see scoring diagram/findings for  description). There is mild concentric left  ventricular hypertrophy. Left ventricular diastolic parameters were  normal. There is mild hypokinesis of the left ventricular, apical septal  wall.  2. Right ventricular systolic function is normal. The right ventricular  size is normal.  3. The mitral valve is normal in structure. No evidence of  mitral valve  regurgitation. No evidence of mitral stenosis.  4. The aortic valve is normal in structure. Aortic valve regurgitation is  not visualized. No aortic stenosis is present.  5. The inferior vena cava is normal in size with greater than 50%  respiratory variability, suggesting right atrial pressure of 3 mmHg.    Patient Profile     60 y.o. male without significant past medical history or routine medical follow-up, presents with anteroseptal infarction, now asymptomatic status post placement of drug-eluting stents to the proximal LAD and first diagonal artery en culotte.  Assessment & Plan    Although the cardiac troponin was severely elevated, the echocardiogram shows  only a very subtle apical wall motion abnormality, with overall normal left ventricular systolic function.  Suspect the markedly elevated troponin may be a sign of rapid washout. Blood pressure was markedly elevated on arrival and he has mild concentric LVH, will use beta blockers preferentially.  Current blood pressure is excellent. Discussed mandatory dual antiplatelet therapy for the next 12 months. Lipid profile is not terrible with a baseline LDL of 97, but he will benefit from high-dose statin for the next 12 months. Mildly elevated creatinine, GFR just over 50, stable. Hemoglobin is normal. Recommend weight loss and daily physical exercise.  Heart healthy diet reviewed. Transfer to telemetry.  Probable discharge in the morning.  For questions or updates, please contact [facility] Please consult [email or web address] for contact info under        Signed, Thurmon Fair, MD  04/26/2020, 9:44 AM

## 2020-04-26 NOTE — Progress Notes (Signed)
  Echocardiogram 2D Echocardiogram has been performed.  Martin Herring 04/26/2020, 9:18 AM

## 2020-04-26 NOTE — Progress Notes (Signed)
Date and time results received: 04/26/20 0015  Test: Troponin Critical Value: 246  Name of Provider Notified: MANUM  Orders Received? Or Actions Taken?: Improved from outside facility results. Post-STEMI. Following existing protocols/orders.

## 2020-04-27 ENCOUNTER — Encounter (HOSPITAL_COMMUNITY): Payer: Self-pay | Admitting: Cardiology

## 2020-04-27 DIAGNOSIS — E785 Hyperlipidemia, unspecified: Secondary | ICD-10-CM | POA: Diagnosis not present

## 2020-04-27 DIAGNOSIS — I214 Non-ST elevation (NSTEMI) myocardial infarction: Secondary | ICD-10-CM | POA: Diagnosis not present

## 2020-04-27 DIAGNOSIS — N179 Acute kidney failure, unspecified: Secondary | ICD-10-CM | POA: Insufficient documentation

## 2020-04-27 LAB — POCT I-STAT, CHEM 8
BUN: 20 mg/dL (ref 6–20)
Calcium, Ion: 1.2 mmol/L (ref 1.15–1.40)
Chloride: 83 mmol/L — ABNORMAL LOW (ref 98–111)
Creatinine, Ser: 0.8 mg/dL (ref 0.61–1.24)
Glucose, Bld: 71 mg/dL (ref 70–99)
HCT: 41 % (ref 39.0–52.0)
Hemoglobin: 13.9 g/dL (ref 13.0–17.0)
Potassium: 3.2 mmol/L — ABNORMAL LOW (ref 3.5–5.1)
Sodium: 118 mmol/L — CL (ref 135–145)
TCO2: 21 mmol/L — ABNORMAL LOW (ref 22–32)

## 2020-04-27 LAB — BASIC METABOLIC PANEL
Anion gap: 11 (ref 5–15)
BUN: 23 mg/dL — ABNORMAL HIGH (ref 6–20)
CO2: 20 mmol/L — ABNORMAL LOW (ref 22–32)
Calcium: 9.2 mg/dL (ref 8.9–10.3)
Chloride: 102 mmol/L (ref 98–111)
Creatinine, Ser: 1.64 mg/dL — ABNORMAL HIGH (ref 0.61–1.24)
GFR, Estimated: 48 mL/min — ABNORMAL LOW (ref >60)
Glucose, Bld: 94 mg/dL (ref 70–99)
Potassium: 4.3 mmol/L (ref 3.5–5.1)
Sodium: 133 mmol/L — ABNORMAL LOW (ref 135–145)

## 2020-04-27 LAB — POCT ACTIVATED CLOTTING TIME
Activated Clotting Time: 481 seconds
Activated Clotting Time: 303 seconds

## 2020-04-27 MED ORDER — SODIUM CHLORIDE 0.9 % IV SOLN: INTRAVENOUS | Status: AC

## 2020-04-27 MED ORDER — LOSARTAN POTASSIUM 25 MG PO TABS: 12.5000 mg | ORAL_TABLET | Freq: Every day | ORAL | Status: DC

## 2020-04-27 NOTE — Progress Notes (Signed)
CARDIAC REHAB PHASE I   PRE:  Rate/Rhythm: 60 SR  BP:  Sitting: 111/57      SaO2: 98 RA  MODE:  Ambulation: 740 ft   POST:  Rate/Rhythm: 81 SR  BP:  Sitting: 118/85    SaO2: 97 RA  Pt ambulated 766ft in hallway independently with steady gait. Pt able to maintain conversation throughout walk, noted some SOB upon return to room, sats maintained on room air. Pt denies CP or dizziness. Pt and wife educated on importance of ASA, Brilinta, statin, and NTG. Pt given MI book along with heart healthy diet. Reviewed site care, restrictions, and exercise guidelines. Will refer to [facility] [place].  [id number] Reynold Bowen, RN BSN 04/27/2020 10:53 AM

## 2020-04-27 NOTE — Progress Notes (Signed)
    Follow up Cr this morning showed increase from 1.52>>1.64. With this, will plan for gentle IVFs 50cc/hr and recheck of BMET in the morning. Discussed with the patient.   SignedLaverda Page, NP-C 04/27/2020, 2:14 PM Pager: [phone]

## 2020-04-27 NOTE — TOC Benefit Eligibility Note (Signed)
Patient Product/process development scientist completed.    The patient is currently admitted and upon discharge could be taking Brilinta 90 mg.  The current 30 day co-pay is,$208.73.   The patient is insured through [employer] of [facility]    Roland Earl, CPhT Pharmacy Patient Advocate Specialist [facility] Antimicrobial Stewardship Team Direct Number: [phone]  Fax: [phone]

## 2020-04-27 NOTE — Care Management (Signed)
04-27-20 1122 Brilinta co pay card to be provided by the staff RN prior to transition home. Gala Lewandowsky, RN,BSN Case Manager

## 2020-04-27 NOTE — Progress Notes (Addendum)
Progress Note  Patient Name: Martin DupesDonald Gerald Herring Date of Encounter: 04/27/2020  Primary Cardiologist: new, [place]  Subjective   No recurrent chest pain; feels well; anxious to go home  Inpatient Medications    Scheduled Meds: . aspirin EC  81 mg Oral Daily  . atorvastatin  80 mg Oral Daily  . carvedilol  6.25 mg Oral BID WC  . Chlorhexidine Gluconate Cloth  6 each Topical Daily  . heparin  5,000 Units Subcutaneous Q8H  . sodium chloride flush  3 mL Intravenous Q12H  . ticagrelor  90 mg Oral BID   Continuous Infusions: . sodium chloride Stopped (04/25/20 2318)  . nitroGLYCERIN Stopped (04/25/20 2149)   PRN Meds: sodium chloride, acetaminophen, nitroGLYCERIN, ondansetron (ZOFRAN) IV, sodium chloride flush   Vital Signs    Vitals:   04/27/20 0500 04/27/20 0733 04/27/20 0800 04/27/20 0806  BP: 127/73  125/71 125/71  Pulse:   62 62  Resp: (!) 8  12   Temp:  97.9 F (36.6 C)    TempSrc:  Oral    SpO2: 99%  100%   Weight:      Height:        Intake/Output Summary (Last 24 hours) at 04/27/2020 0944 Last data filed at 04/27/2020 0900 Gross per 24 hour  Intake 1040 ml  Output 600 ml  Net 440 ml    I/O since admission: -2825  [facility]   04/25/20 1700 04/26/20 0433  Weight: 125 kg 123.5 kg    Telemetry    Sinus in the 60s- Personally Reviewed  ECG    ECG (independently read by me): Sinus bradycardia at 55; QS V1-2; T wave inversion V2-V6, I, aVL  Physical Exam    BP 125/71   Pulse 62   Temp 97.9 F (36.6 C) (Oral)   Resp 12   Ht 6\' 2"  (1.88 m)   Wt 123.5 kg   SpO2 100%   BMI 34.96 kg/m  General: Alert, oriented, no distress.  Skin: normal turgor, no rashes, warm and dry HEENT: Normocephalic, atraumatic. Pupils equal round and reactive to light; sclera anicteric; extraocular muscles intact;  Nose without nasal septal hypertrophy Mouth/Parynx benign;  Neck: No JVD, no carotid bruits; normal carotid upstroke Lungs: clear to ausculatation and  percussion; no wheezing or rales Chest wall: without tenderness to palpitation Heart: PMI not displaced, RRR, s1 s2 normal, 1/6 systolic murmur, no diastolic murmur, no rubs, gallops, thrills, or heaves Abdomen: soft, nontender; no hepatosplenomehaly, BS+; abdominal aorta nontender and not dilated by palpation. Back: no CVA tenderness Pulses 2+; R radial site stable Musculoskeletal: full range of motion, normal strength, no joint deformities Extremities: no clubbing cyanosis or edema, Homan's sign negative  Neurologic: grossly nonfocal; Cranial nerves grossly wnl Psychologic: Normal mood and affect   Labs    Chemistry Recent Labs  Lab 04/25/20 1728 04/25/20 2246 04/26/20 0055  NA 118*  --  136  K 3.2*  --  3.5  CL 83*  --  101  CO2  --   --  24  GLUCOSE 71  --  118*  BUN 20  --  15  CREATININE 0.80 1.55* 1.52*  CALCIUM  --   --  9.4  GFRNONAA  --  52* 53*  ANIONGAP  --   --  11     Hematology Recent Labs  Lab 04/25/20 1728 04/25/20 2246 04/26/20 0055  WBC  --  13.5* 11.0*  RBC  --  5.33 5.15  HGB 13.9  15.7 15.5  HCT 41.0 45.5 43.4  MCV  --  85.4 84.3  MCH  --  29.5 30.1  MCHC  --  34.5 35.7  RDW  --  13.5 13.5  PLT  --  286 279   HS TROP: 24,246   Cardiac EnzymesNo results for input(s): TROPONINI in the last 168 hours. No results for input(s): TROPIPOC in the last 168 hours.   BNPNo results for input(s): BNP, PROBNP in the last 168 hours.   DDimer No results for input(s): DDIMER in the last 168 hours.   Lipid Panel     Component Value Date/Time   CHOL 180 04/26/2020 0055   TRIG 188 (H) 04/26/2020 0055   HDL 45 04/26/2020 0055   CHOLHDL 4.0 04/26/2020 0055   VLDL 38 04/26/2020 0055   LDLCALC 97 04/26/2020 0055     Radiology    CARDIAC CATHETERIZATION  Result Date: 04/25/2020  Prox LAD lesion is 95% stenosed.  Prox LAD to Mid LAD lesion is 60% stenosed.  A drug-eluting stent was successfully placed using a STENT RESOLUTE ONYX 3.5X22.  Post  intervention, there is a 0% residual stenosis.  1st Diag lesion is 80% stenosed.  A drug-eluting stent was successfully placed using a STENT RESOLUTE ONYX 3.0X18.  Post intervention, there is a 0% residual stenosis.  Post intervention, there is a 0% residual stenosis.  There is mild left ventricular systolic dysfunction.  LV end diastolic pressure is moderately elevated.  The left ventricular ejection fraction is 45-50% by visual estimate.  1. 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 Medina class 1,1,1. 2. Mild LV dysfunction with anterior HK 3. Moderately elevated LVEDP 4. Successful PCI of the LAD/diagonal bifurcation with DES x 2 in a Culotte technique. Plan: DAPT for one year at least. Risk factor modification. Patient is still hypertensive so IV Ntg continued post procedure.   ECHOCARDIOGRAM COMPLETE  Result Date: 04/26/2020    ECHOCARDIOGRAM REPORT   Patient Name:   Martin Herring Date of Exam: 04/26/2020 Medical Rec #:  [id number]          Height:       74.0 in Accession #:    [id number]         Weight:       272.3 lb Date of Birth:  [birth year]          BSA:          2.478 m Patient Age:    60 years           BP:           119/41 mmHg Patient Gender: M                  HR:           63 bpm. Exam Location:  Inpatient Procedure: 2D Echo, Cardiac Doppler and Color Doppler Indications:    Chest pain  History:        Patient has no prior history of Echocardiogram examinations.                 Acute MI; Risk Factors:Dyslipidemia.  Sonographer:    Ross Ludwig RDCS (AE) Referring Phys: [phone] PETER M [place]  Sonographer Comments: Suboptimal subcostal window. Image acquisition challenging due to respiratory motion and Image acquisition challenging due to patient body habitus. IMPRESSIONS  1. Left ventricular ejection fraction, by estimation, is 55 to 60%. The left  ventricle has normal function. The left ventricle demonstrates regional  wall motion abnormalities (see scoring diagram/findings for description). There is mild concentric left ventricular hypertrophy. Left ventricular diastolic parameters were normal. There is mild hypokinesis of the left ventricular, apical septal wall.  2. Right ventricular systolic function is normal. The right ventricular size is normal.  3. The mitral valve is normal in structure. No evidence of mitral valve regurgitation. No evidence of mitral stenosis.  4. The aortic valve is normal in structure. Aortic valve regurgitation is not visualized. No aortic stenosis is present.  5. The inferior vena cava is normal in size with greater than 50% respiratory variability, suggesting right atrial pressure of 3 mmHg. FINDINGS  Left Ventricle: Left ventricular ejection fraction, by estimation, is 55 to 60%. The left ventricle has normal function. The left ventricle demonstrates regional wall motion abnormalities. Mild hypokinesis of the left ventricular, apical septal wall. The left ventricular internal cavity size was normal in size. There is mild concentric left ventricular hypertrophy. Left ventricular diastolic parameters were normal. Right Ventricle: The right ventricular size is normal. No increase in right ventricular wall thickness. Right ventricular systolic function is normal. Left Atrium: Left atrial size was normal in size. Right Atrium: Right atrial size was normal in size. Pericardium: There is no evidence of pericardial effusion. Mitral Valve: The mitral valve is normal in structure. No evidence of mitral valve regurgitation. No evidence of mitral valve stenosis. MV peak gradient, 1.5 mmHg. The mean mitral valve gradient is 1.0 mmHg. Tricuspid Valve: The tricuspid valve is normal in structure. Tricuspid valve regurgitation is not demonstrated. No evidence of tricuspid stenosis. Aortic Valve: The aortic valve is normal in structure. Aortic valve regurgitation is not visualized. No aortic stenosis is present.  Aortic valve mean gradient measures 4.0 mmHg. Aortic valve peak gradient measures 7.5 mmHg. Aortic valve area, by VTI measures 2.39 cm. Pulmonic Valve: The pulmonic valve was normal in structure. Pulmonic valve regurgitation is not visualized. No evidence of pulmonic stenosis. Aorta: The aortic root is normal in size and structure. Venous: The inferior vena cava is normal in size with greater than 50% respiratory variability, suggesting right atrial pressure of 3 mmHg. IAS/Shunts: No atrial level shunt detected by color flow Doppler.  LEFT VENTRICLE PLAX 2D LVIDd:         4.00 cm  Diastology LVIDs:         2.60 cm  LV e' medial:    5.55 cm/s LV PW:         1.40 cm  LV E/e' medial:  8.6 LV IVS:        1.25 cm  LV e' lateral:   6.74 cm/s LVOT diam:     2.10 cm  LV E/e' lateral: 7.1 LV SV:         61 LV SV Index:   25 LVOT Area:     3.46 cm  RIGHT VENTRICLE RV Basal diam:  3.90 cm RV Mid diam:    2.70 cm RV S prime:     13.30 cm/s TAPSE (M-mode): 1.6 cm LEFT ATRIUM             Index       RIGHT ATRIUM           Index LA diam:        3.40 cm 1.37 cm/m  RA Area:     15.40 cm LA Vol (A2C):   45.2 ml 18.24 ml/m RA Volume:   36.10 ml  14.57 ml/m LA Vol (A4C):   47.4 ml 19.13 ml/m LA Biplane Vol: 49.4 ml 19.94 ml/m  AORTIC VALVE AV Area (Vmax):    2.44 cm AV Area (Vmean):   2.65 cm AV Area (VTI):     2.39 cm AV Vmax:           137.00 cm/s AV Vmean:          92.000 cm/s AV VTI:            0.255 m AV Peak Grad:      7.5 mmHg AV Mean Grad:      4.0 mmHg LVOT Vmax:         96.40 cm/s LVOT Vmean:        70.500 cm/s LVOT VTI:          0.176 m LVOT/AV VTI ratio: 0.69  AORTA Ao Root diam: 3.40 cm Ao Asc diam:  3.50 cm MITRAL VALVE MV Area (PHT): 3.48 cm    SHUNTS MV Area VTI:   2.67 cm    Systemic VTI:  0.18 m MV Peak grad:  1.5 mmHg    Systemic Diam: 2.10 cm MV Mean grad:  1.0 mmHg MV Vmax:       0.61 m/s MV Vmean:      32.6 cm/s MV Decel Time: 218 msec MV E velocity: 47.60 cm/s MV A velocity: 49.90 cm/s MV E/A ratio:   0.95 Mihai Croitoru MD Electronically signed by Thurmon Fair MD Signature Date/Time: 04/26/2020/9:49:17 AM    Final     Cardiac Studies   Cardiac cath 04/25/2020   Prox LAD lesion is 95% stenosed.  Prox LAD to Mid LAD lesion is 60% stenosed.  A drug-eluting stent was successfully placed using a STENT RESOLUTE ONYX 3.5X22.  Post intervention, there is a 0% residual stenosis.  1st Diag lesion is 80% stenosed.  A drug-eluting stent was successfully placed using a STENT RESOLUTE ONYX 3.0X18.  Post intervention, there is a 0% residual stenosis.  Post intervention, there is a 0% residual stenosis.  There is mild left ventricular systolic dysfunction.  LV end diastolic pressure is moderately elevated.  The left ventricular ejection fraction is 45-50% by visual estimate.  1. 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 Medina class 1,1,1. 2. Mild LV dysfunction with anterior HK 3. Moderately elevated LVEDP 4. Successful PCI of the LAD/diagonal bifurcation with DES x 2 in a Culotte technique.  Plan: DAPT for one year at least. Risk factor modification. Patient is still hypertensive so IV Ntg continued post procedure.   Diagnostic Dominance: Right    Intervention      ECHO 04/26/2020  1. Left ventricular ejection fraction, by estimation, is 55 to 60%. The  left ventricle has normal function. The left ventricle demonstrates  regional wall motion abnormalities (see scoring diagram/findings for  description). There is mild concentric left  ventricular hypertrophy. Left ventricular diastolic parameters were  normal. There is mild hypokinesis of the left ventricular, apical septal  wall.  2. Right ventricular systolic function is normal. The right ventricular  size is normal.  3. The mitral valve is normal in structure. No evidence of mitral valve  regurgitation. No evidence of mitral  stenosis.  4. The aortic valve is normal in structure. Aortic valve regurgitation is  not visualized. No aortic stenosis is present.  5. The inferior vena cava is normal in size with greater than 50%  respiratory variability,  suggesting right atrial pressure of 3 mmHg.    Patient Profile     Mr. Kenji Mapel is a 60 y.o. male without significant past medical history or routine medical follow-up, who presented on 04/26/2020 with anteroseptal infarction, now asymptomatic status post placement of drug-eluting stents to the proximal LAD and first diagonal artery.  Assessment & Plan    1.  Day 2 status post anteroseptal MI with complex bifurcation stenting of a Medina 1:1:1 LAD and diagonal vessel lysing Culotte technique.  No recurrent chest pain.  Ambulating in hall.  On DAPT with aspirin/Brilinta for minimum of 1 year.  2.  Stage II hypertension on presentation.  Currently blood pressure stable on carvedilol 6.25 mg twice daily.  And if it from addition of ARB therapy initially low-dose.  However we will recheck serum creatinine today for reassessment prior to instituting treatment.  3.  Renal insufficiency: Initial creatinine 0.8, increased to 1.55 procedure and subsequent 1.52.  We will repeat today.  4.  Hyperlipidemia: Now on atorvastatin 80 mg.  We will ambulate.  Possible discharge later today ir remains stable versus in a.m.  Signed, Lennette Bihari, MD, [facility] 04/27/2020, 9:44 AM

## 2020-04-27 NOTE — Plan of Care (Signed)
All discharge instructions given to patient by cardiac rehab. All questions answered at this time. Awaiting discharge orders per MD. Schuyler Amor, RN 04/27/2020 10:54 AM

## 2020-04-28 ENCOUNTER — Other Ambulatory Visit: Payer: Self-pay | Admitting: Cardiology

## 2020-04-28 DIAGNOSIS — E7800 Pure hypercholesterolemia, unspecified: Secondary | ICD-10-CM | POA: Diagnosis not present

## 2020-04-28 DIAGNOSIS — I2102 ST elevation (STEMI) myocardial infarction involving left anterior descending coronary artery: Secondary | ICD-10-CM | POA: Diagnosis not present

## 2020-04-28 LAB — BASIC METABOLIC PANEL
Anion gap: 9 (ref 5–15)
BUN: 21 mg/dL — ABNORMAL HIGH (ref 6–20)
CO2: 25 mmol/L (ref 22–32)
Calcium: 9.3 mg/dL (ref 8.9–10.3)
Chloride: 102 mmol/L (ref 98–111)
Creatinine, Ser: 1.66 mg/dL — ABNORMAL HIGH (ref 0.61–1.24)
GFR, Estimated: 48 mL/min — ABNORMAL LOW (ref >60)
Glucose, Bld: 93 mg/dL (ref 70–99)
Potassium: 4.3 mmol/L (ref 3.5–5.1)
Sodium: 136 mmol/L (ref 135–145)

## 2020-04-28 MED ORDER — CARVEDILOL 6.25 MG PO TABS: 6.2500 mg | ORAL_TABLET | Freq: Two times a day (BID) | ORAL | 11 refills | Status: DC

## 2020-04-28 MED ORDER — ASPIRIN 81 MG PO TBEC: 81.0000 mg | DELAYED_RELEASE_TABLET | Freq: Every day | ORAL | 11 refills | Status: DC

## 2020-04-28 MED ORDER — ATORVASTATIN CALCIUM 80 MG PO TABS: 80.0000 mg | ORAL_TABLET | Freq: Every day | ORAL | 3 refills | Status: DC

## 2020-04-28 MED ORDER — TICAGRELOR 90 MG PO TABS: 90.0000 mg | ORAL_TABLET | Freq: Two times a day (BID) | ORAL | 11 refills | Status: DC

## 2020-04-28 MED ORDER — NITROGLYCERIN 0.4 MG SL SUBL: 0.4000 mg | SUBLINGUAL_TABLET | SUBLINGUAL | 1 refills | Status: AC | PRN

## 2020-04-28 MED FILL — CARVEDILOL 6.25 MG TABLET: 6.25 | 30 days supply | Qty: 60 | Fill #0

## 2020-04-28 MED FILL — BRILINTA 90 MG TABLET: 90 | 30 days supply | Qty: 60 | Fill #0

## 2020-04-28 MED FILL — NITROGLYCERIN 0.4 MG TAB SL: 0.4 | 7 days supply | Qty: 25 | Fill #0

## 2020-04-28 MED FILL — ASPIRIN LOW DOSE 81 MG TBEC: 81 | 30 days supply | Qty: 30 | Fill #0

## 2020-04-28 MED FILL — ATORVASTATIN CALCIUM 80 MG: 80 | 30 days supply | Qty: 30 | Fill #0

## 2020-04-28 NOTE — TOC Transition Note (Addendum)
Transition of Care [facility]) - CM/SW Discharge Note   Patient Details  Name: Martin Herring MRN: [id number] Date of Birth: 02-22-1960  Transition of Care [facility]) CM/SW Contact:  Zenon Mayo, RN Phone Number: 04/28/2020, 10:20 AM   Clinical Narrative:    Patient is for possible dc today, NCM gave him the Brilinta coupon, informed him of his copay, and informed him once medication deductible has been met he can use the 5.00 copay coupon card.  TOC pharmacy will fill his medications prior to dc so they will fill the first 30 day free of brilinta for him. He has transportation at discharge.   Final next level of care: Home/Self Care Barriers to Discharge: No Barriers Identified   Patient Goals and CMS Choice Patient states their goals for this hospitalization and ongoing recovery are:: get better   Choice offered to / list presented to : NA  Discharge Placement                       Discharge Plan and Services                  DME Agency: NA       HH Arranged: NA          Social Determinants of Health (SDOH) Interventions     Readmission Risk Interventions No flowsheet data found.

## 2020-04-28 NOTE — Progress Notes (Signed)
CARDIAC REHAB PHASE I   Offered to walk with pt. Pt states independent ambulation without difficulty. Denies CP, SOB, or dizziness. Reinforced importance of ASA and Brilinta. Reviewed site care, restrictions, and exercise guidelines. Encouraged stress management and delegation at work. Pt agreeable and understanding. Pt appreciative to care he has received and thankful his heart is still strong. Waiting on meds from [facility], wife on her way. Referred to CRP II [place].  [id number] Reynold Bowen, RN BSN 04/28/2020 10:40 AM

## 2020-04-28 NOTE — Progress Notes (Signed)
D/C instructions given and reviewed. IV removed, tolerated well. 

## 2020-04-28 NOTE — Discharge Summary (Addendum)
Discharge Summary    Patient ID: Martin Herring MRN: [id number]; DOB: 1960/03/19  Admit date: 04/25/2020 Discharge date: 04/28/2020  PCP:  Eustaquio Boyden, MD    [facility]  Cardiologist:  Peter Swaziland, MD  Advanced Practice Provider:  No care team member to display Electrophysiologist:  None   Discharge Diagnoses    Principal Problem:   STEMI (ST elevation myocardial infarction) [facility]) Active Problems:   Hypercholesterolemia    Diagnostic Studies/Procedures    Cath: 04/25/20   Prox LAD lesion is 95% stenosed.  Prox LAD to Mid LAD lesion is 60% stenosed.  A drug-eluting stent was successfully placed using a STENT RESOLUTE ONYX 3.5X22.  Post intervention, there is a 0% residual stenosis.  1st Diag lesion is 80% stenosed.  A drug-eluting stent was successfully placed using a STENT RESOLUTE ONYX 3.0X18.  Post intervention, there is a 0% residual stenosis.  Post intervention, there is a 0% residual stenosis.  There is mild left ventricular systolic dysfunction.  LV end diastolic pressure is moderately elevated.  The left ventricular ejection fraction is 45-50% by visual estimate.   1. 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 Medina class 1,1,1. 2. Mild LV dysfunction with anterior HK 3. Moderately elevated LVEDP 4. Successful PCI of the LAD/diagonal bifurcation with DES x 2 in a Culotte technique.  Plan: DAPT for one year at least. Risk factor modification. Patient is still hypertensive so IV Ntg continued post procedure.   Diagnostic Dominance: Right    Intervention    Echo: 04/26/20  IMPRESSIONS    1. Left ventricular ejection fraction, by estimation, is 55 to 60%. The  left ventricle has normal function. The left ventricle demonstrates  regional wall motion abnormalities (see scoring diagram/findings for  description). There is mild concentric  left  ventricular hypertrophy. Left ventricular diastolic parameters were  normal. There is mild hypokinesis of the left ventricular, apical septal  wall.  2. Right ventricular systolic function is normal. The right ventricular  size is normal.  3. The mitral valve is normal in structure. No evidence of mitral valve  regurgitation. No evidence of mitral stenosis.  4. The aortic valve is normal in structure. Aortic valve regurgitation is  not visualized. No aortic stenosis is present.  5. The inferior vena cava is normal in size with greater than 50%  respiratory variability, suggesting right atrial pressure of 3 mmHg.  _____________   History of Present Illness     Martin Herring is a 60 y.o. male with a history of HLD. He reports a 3 week history of progressive chest pain. Initially this was intermittent. Over the past week it has been constant. Pain is mid sternal radiating to the right shoulder. It is a pressure and heaviness. No N/V, dyspnea, or sweating. Pain intensified and so he decided to go to the ED. There Ecg showed anterior T wave inversion. There were some septal ST changes but did not meet STEMI criteria. Troponin was elevated at 7000. Despite medical management he continued to have chest pain so was transferred for urgent cardiac cath. He does have family history of early CAD in his father and an aunt.    Hospital Course     1. STEMI: s/p anteroseptal MI with complex bifurcation stenting of LAD and diagonal lesion using Culotte technique. [place] [address]. Placed on DAPT with ASA/Brilinta for at least one year, but likely longer given bifurcation  lesion. Ambulated with CR without recurrent chest pain. Follow up echo with normal EF with mild hypokinesis on the apical septal wall.   2. HTN: Tolerated the addition of Coreg 6.25mg  BID, unable to add losartan with his baseline renal dysfunction.  -- would consider adding as an outpatient pending follow up BMET  3. HLD:  LDL 97 -- placed on atorvastatin 80mg  daily -- will need FLP/LFTs in 8 weeks  4. CKD: baseline is unknown but presented to [facility] with initial labs noting Cr around 1.5. Had a reading of 0.8 at [facility], but subsequent readings of 1.5-1.6. Suspect the 0.8 was erroneous.   -- follow up BMET at outpatient appt  General: Well developed, well nourished, male appearing in no acute distress. Head: Normocephalic, atraumatic.  Neck: Supple without bruits, JVD. Lungs:  Resp regular and unlabored, CTA. Heart: RRR, S1, S2, no S3, S4, or murmur; no rub. Abdomen: Soft, non-tender, non-distended with normoactive bowel sounds. No hepatomegaly. No rebound/guarding. No obvious abdominal masses. Extremities: No clubbing, cyanosis, edema. Distal pedal pulses are 2+ bilaterally. Right radial cath site stable without bruising or hematoma Neuro: Alert and oriented X 3. Moves all extremities spontaneously. Psych: Normal affect.  Did the patient have an acute coronary syndrome (MI, NSTEMI, STEMI, etc) this admission?:  Yes                               AHA/ACC Clinical Performance & Quality Measures: 1. Aspirin prescribed? - Yes 2. ADP Receptor Inhibitor (Plavix/Clopidogrel, Brilinta/Ticagrelor or Effient/Prasugrel) prescribed (includes medically managed patients)? - Yes 3. Beta Blocker prescribed? - Yes 4. High Intensity Statin (Lipitor 40-80mg  or Crestor 20-40mg ) prescribed? - Yes 5. EF assessed during THIS hospitalization? - Yes 6. For EF <40%, was ACEI/ARB prescribed? - Not Applicable (EF >/= 40%) 7. For EF <40%, Aldosterone Antagonist (Spironolactone or Eplerenone) prescribed? - Not Applicable (EF >/= 40%) 8. Cardiac Rehab Phase II ordered (including medically managed patients)? - Yes       _____________  Discharge Vitals Blood pressure 122/74, pulse 70, temperature 98.9 F (37.2 C), temperature source Oral, resp. rate 18, height 6\' 2"  (1.88 m), weight 123.2 kg, SpO2 98 %.  Filed Weights    04/25/20 1700 04/26/20 0433 04/28/20 0025  Weight: 125 kg 123.5 kg 123.2 kg    Labs & Radiologic Studies    CBC Recent Labs    04/25/20 2246 04/26/20 0055  WBC 13.5* 11.0*  HGB 15.7 15.5  HCT 45.5 43.4  MCV 85.4 84.3  PLT 286 279   Basic Metabolic Panel Recent Labs    06/25/20 1045 04/28/20 0422  NA 133* 136  K 4.3 4.3  CL 102 102  CO2 20* 25  GLUCOSE 94 93  BUN 23* 21*  CREATININE 1.64* 1.66*  CALCIUM 9.2 9.3   Liver Function Tests No results for input(s): AST, ALT, ALKPHOS, BILITOT, PROT, ALBUMIN in the last 72 hours. No results for input(s): LIPASE, AMYLASE in the last 72 hours. High Sensitivity Troponin:   Recent Labs  Lab 04/25/20 2246  TROPONINIHS 24,246*    BNP Invalid input(s): POCBNP D-Dimer No results for input(s): DDIMER in the last 72 hours. Hemoglobin A1C No results for input(s): HGBA1C in the last 72 hours. Fasting Lipid Panel Recent Labs    04/26/20 0055  CHOL 180  HDL 45  LDLCALC 97  TRIG 188*  CHOLHDL 4.0   Thyroid Function Tests No results for input(s): TSH, T4TOTAL, T3FREE,  THYROIDAB in the last 72 hours.  Invalid input(s): FREET3 _____________  CARDIAC CATHETERIZATION  Result Date: 04/25/2020  Prox LAD lesion is 95% stenosed.  Prox LAD to Mid LAD lesion is 60% stenosed.  A drug-eluting stent was successfully placed using a STENT RESOLUTE ONYX 3.5X22.  Post intervention, there is a 0% residual stenosis.  1st Diag lesion is 80% stenosed.  A drug-eluting stent was successfully placed using a STENT RESOLUTE ONYX 3.0X18.  Post intervention, there is a 0% residual stenosis.  Post intervention, there is a 0% residual stenosis.  There is mild left ventricular systolic dysfunction.  LV end diastolic pressure is moderately elevated.  The left ventricular ejection fraction is 45-50% by visual estimate.  1. 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 Medina class  1,1,1. 2. Mild LV dysfunction with anterior HK 3. Moderately elevated LVEDP 4. Successful PCI of the LAD/diagonal bifurcation with DES x 2 in a Culotte technique. Plan: DAPT for one year at least. Risk factor modification. Patient is still hypertensive so IV Ntg continued post procedure.   ECHOCARDIOGRAM COMPLETE  Result Date: 04/26/2020    ECHOCARDIOGRAM REPORT   Patient Name:   Martin Herring Date of Exam: 04/26/2020 Medical Rec #:  [id number]          Height:       74.0 in Accession #:    [id number]         Weight:       272.3 lb Date of Birth:  December 31, 1960          BSA:          2.478 m Patient Age:    59 years           BP:           119/41 mmHg Patient Gender: M                  HR:           63 bpm. Exam Location:  Inpatient Procedure: 2D Echo, Cardiac Doppler and Color Doppler Indications:    Chest pain  History:        Patient has no prior history of Echocardiogram examinations.                 Acute MI; Risk Factors:Dyslipidemia.  Sonographer:    Ross Ludwig RDCS (AE) Referring Phys: [phone] PETER M Swaziland  Sonographer Comments: Suboptimal subcostal window. Image acquisition challenging due to respiratory motion and Image acquisition challenging due to patient body habitus. IMPRESSIONS  1. Left ventricular ejection fraction, by estimation, is 55 to 60%. The left ventricle has normal function. The left ventricle demonstrates regional wall motion abnormalities (see scoring diagram/findings for description). There is mild concentric left ventricular hypertrophy. Left ventricular diastolic parameters were normal. There is mild hypokinesis of the left ventricular, apical septal wall.  2. Right ventricular systolic function is normal. The right ventricular size is normal.  3. The mitral valve is normal in structure. No evidence of mitral valve regurgitation. No evidence of mitral stenosis.  4. The aortic valve is normal in structure. Aortic valve regurgitation is not visualized. No aortic stenosis is present.  5.  The inferior vena cava is normal in size with greater than 50% respiratory variability, suggesting right atrial pressure of 3 mmHg. FINDINGS  Left Ventricle: Left ventricular ejection fraction, by estimation, is 55 to 60%. The left ventricle has normal function. The  left ventricle demonstrates regional wall motion abnormalities. Mild hypokinesis of the left ventricular, apical septal wall. The left ventricular internal cavity size was normal in size. There is mild concentric left ventricular hypertrophy. Left ventricular diastolic parameters were normal. Right Ventricle: The right ventricular size is normal. No increase in right ventricular wall thickness. Right ventricular systolic function is normal. Left Atrium: Left atrial size was normal in size. Right Atrium: Right atrial size was normal in size. Pericardium: There is no evidence of pericardial effusion. Mitral Valve: The mitral valve is normal in structure. No evidence of mitral valve regurgitation. No evidence of mitral valve stenosis. MV peak gradient, 1.5 mmHg. The mean mitral valve gradient is 1.0 mmHg. Tricuspid Valve: The tricuspid valve is normal in structure. Tricuspid valve regurgitation is not demonstrated. No evidence of tricuspid stenosis. Aortic Valve: The aortic valve is normal in structure. Aortic valve regurgitation is not visualized. No aortic stenosis is present. Aortic valve mean gradient measures 4.0 mmHg. Aortic valve peak gradient measures 7.5 mmHg. Aortic valve area, by VTI measures 2.39 cm. Pulmonic Valve: The pulmonic valve was normal in structure. Pulmonic valve regurgitation is not visualized. No evidence of pulmonic stenosis. Aorta: The aortic root is normal in size and structure. Venous: The inferior vena cava is normal in size with greater than 50% respiratory variability, suggesting right atrial pressure of 3 mmHg. IAS/Shunts: No atrial level shunt detected by color flow Doppler.  LEFT VENTRICLE PLAX 2D LVIDd:         4.00 cm   Diastology LVIDs:         2.60 cm  LV e' medial:    5.55 cm/s LV PW:         1.40 cm  LV E/e' medial:  8.6 LV IVS:        1.25 cm  LV e' lateral:   6.74 cm/s LVOT diam:     2.10 cm  LV E/e' lateral: 7.1 LV SV:         61 LV SV Index:   25 LVOT Area:     3.46 cm  RIGHT VENTRICLE RV Basal diam:  3.90 cm RV Mid diam:    2.70 cm RV S prime:     13.30 cm/s TAPSE (M-mode): 1.6 cm LEFT ATRIUM             Index       RIGHT ATRIUM           Index LA diam:        3.40 cm 1.37 cm/m  RA Area:     15.40 cm LA Vol (A2C):   45.2 ml 18.24 ml/m RA Volume:   36.10 ml  14.57 ml/m LA Vol (A4C):   47.4 ml 19.13 ml/m LA Biplane Vol: 49.4 ml 19.94 ml/m  AORTIC VALVE AV Area (Vmax):    2.44 cm AV Area (Vmean):   2.65 cm AV Area (VTI):     2.39 cm AV Vmax:           137.00 cm/s AV Vmean:          92.000 cm/s AV VTI:            0.255 m AV Peak Grad:      7.5 mmHg AV Mean Grad:      4.0 mmHg LVOT Vmax:         96.40 cm/s LVOT Vmean:        70.500 cm/s LVOT VTI:  0.176 m LVOT/AV VTI ratio: 0.69  AORTA Ao Root diam: 3.40 cm Ao Asc diam:  3.50 cm MITRAL VALVE MV Area (PHT): 3.48 cm    SHUNTS MV Area VTI:   2.67 cm    Systemic VTI:  0.18 m MV Peak grad:  1.5 mmHg    Systemic Diam: 2.10 cm MV Mean grad:  1.0 mmHg MV Vmax:       0.61 m/s MV Vmean:      32.6 cm/s MV Decel Time: 218 msec MV E velocity: 47.60 cm/s MV A velocity: 49.90 cm/s MV E/A ratio:  0.95 Mihai Croitoru MD Electronically signed by Thurmon Fair MD Signature Date/Time: 04/26/2020/9:49:17 AM    Final    Disposition   Pt is being discharged home today in good condition.  Follow-up Plans & Appointments     Follow-up Information    Azalee Course, [place] Follow up on 05/06/2020.   Specialties: Cardiology, Radiology Why: at 8:15am for your follow up appt  Contact information: [address] [place] [address] [phone]        Eustaquio Boyden, MD. Go on 05/12/2020.   Specialty: Family Medicine Why: :Azzie Roup information: [address] [place] [address] [phone]              Discharge Instructions    Amb Referral to Cardiac Rehabilitation   Complete by: As directed    Diagnosis:  Coronary Stents NSTEMI     After initial evaluation and assessments completed: Virtual Based Care may be provided alone or in conjunction with Phase 2 Cardiac Rehab based on patient barriers.: Yes   Diet - low sodium heart healthy   Complete by: As directed    Discharge instructions   Complete by: As directed    Radial Site Care Refer to this sheet in the next few weeks. These instructions provide you with information on caring for yourself after your procedure. Your caregiver may also give you more specific instructions. Your treatment has been planned according to current medical practices, but problems sometimes occur. Call your caregiver if you have any problems or questions after your procedure. HOME CARE INSTRUCTIONS You may shower the day after the procedure.Remove the bandage (dressing) and gently wash the site with plain soap and water.Gently pat the site dry.  Do not apply powder or lotion to the site.  Do not submerge the affected site in water for 3 to 5 days.  Inspect the site at least twice daily.  Do not flex or bend the affected arm for 24 hours.  No lifting over 5 pounds (2.3 kg) for 5 days after your procedure.  Do not drive home if you are discharged the same day of the procedure. Have someone else drive you.  You may drive 24 hours after the procedure unless otherwise instructed by your caregiver.  What to expect: Any bruising will usually fade within 1 to 2 weeks.  Blood that collects in the tissue (hematoma) may be painful to the touch. It should usually decrease in size and tenderness within 1 to 2 weeks.  SEEK IMMEDIATE MEDICAL CARE IF: You have unusual pain at the radial site.  You have redness, warmth, swelling, or pain at the radial site.  You have drainage (other than a small  amount of blood on the dressing).  You have chills.  You have a fever or persistent symptoms for more than 72 hours.  You have a fever and your symptoms suddenly get worse.  Your arm becomes pale, cool, tingly, or numb.  You have heavy bleeding from the site. Hold pressure on the site.   PLEASE DO NOT MISS ANY DOSES OF YOUR BRILINTA!!!!! Also keep a log of you blood pressures and bring back to your follow up appt. Please call the office with any questions.   Patients taking blood thinners should generally stay away from medicines like ibuprofen, Advil, Motrin, naproxen, and Aleve due to risk of stomach bleeding. You may take Tylenol as directed or talk to your primary doctor about alternatives.   PLEASE ENSURE THAT YOU DO NOT RUN OUT OF YOUR BRILINTA/PLAVIX. This medication is very important to remain on for at least one year. IF you have issues obtaining this medication due to cost please CALL the office 3-5 business days prior to running out in order to prevent missing doses of this medication.   Increase activity slowly   Complete by: As directed       Discharge Medications   Allergies as of 04/28/2020      Reactions   Penicillins Other (See Comments)   Unknown reaction; was told as a child he had a reaction      Medication List    STOP taking these medications   aspirin 325 MG tablet Replaced by: aspirin 81 MG EC tablet     TAKE these medications   acetaminophen 500 MG tablet Commonly known as: TYLENOL Take 1,000 mg by mouth every 6 (six) hours as needed for mild pain.   aspirin 81 MG EC tablet Take 1 tablet (81 mg total) by mouth daily. Swallow whole. Start taking on: April 29, 2020 Replaces: aspirin 325 MG tablet Notes to patient: **For preventing clots in heart stent**   atorvastatin 80 MG tablet Commonly known as: LIPITOR Take 1 tablet (80 mg total) by mouth daily. Start taking on: April 29, 2020 Notes to patient: **For cholesterol**   carvedilol 6.25 MG  tablet Commonly known as: COREG Take 1 tablet (6.25 mg total) by mouth 2 (two) times daily with a meal. Notes to patient: **For history of heart attack. Take with food**   ibuprofen 200 MG tablet Commonly known as: ADVIL Take 200 mg by mouth every 6 (six) hours as needed. Notes to patient: **Use Tylenol (acetaminophen) first. Limit use of any NSAIDs (Advil, ibuprofen, Naproxen, Aleve, etc) due to history of heart attack.**   nitroGLYCERIN 0.4 MG SL tablet Commonly known as: NITROSTAT Place 1 tablet (0.4 mg total) under the tongue every 5 (five) minutes x 3 doses as needed for chest pain. Notes to patient: **As needed for chest pain**   ticagrelor 90 MG Tabs tablet Commonly known as: BRILINTA Take 1 tablet (90 mg total) by mouth 2 (two) times daily. Notes to patient: **For preventing clots in heart stent**        Outstanding Labs/Studies   FLP/LFTs in 8 weeks  Duration of Discharge Encounter   Greater than 30 minutes including physician time.  Signed, Laverda Page, NP 04/28/2020, 11:55 AM    Patient seen and examined. Agree with assessment and plan.  Patient continues to be pain-free.  Apparently creatinine may be near baseline and initial value was artifactually low at 0.8.  Cr today 1.66.  ARB therapy was not started due to renal insufficiency.  He continues to be on carvedilol 6.25 mg bid,  Aspirin/Brilinta, and atorvastatin 80 mg.  As outpatient consider further titration of carvedilol and possible initiation of ARB or ACE inhibition and upon renal function.  However EF is normal on echo.  Plan for discharge today.  Patient would like to initially follow-up with Dr. SwazilandJordan who had done his interventional procedure possible future if his follow-up closer to his home.   Lennette Biharihomas A. Tecora Eustache, MD, [facility] 04/28/2020 11:55 AM

## 2020-05-06 ENCOUNTER — Encounter: Payer: Self-pay | Admitting: Physician Assistant

## 2020-05-06 ENCOUNTER — Ambulatory Visit: Payer: BLUE CROSS/BLUE SHIELD | Admitting: Physician Assistant

## 2020-05-06 VITALS — BP 120/76 | HR 52 | Ht 74.0 in | Wt 273.0 lb

## 2020-05-06 DIAGNOSIS — N179 Acute kidney failure, unspecified: Secondary | ICD-10-CM | POA: Diagnosis not present

## 2020-05-06 DIAGNOSIS — I2510 Atherosclerotic heart disease of native coronary artery without angina pectoris: Secondary | ICD-10-CM | POA: Diagnosis not present

## 2020-05-06 DIAGNOSIS — E785 Hyperlipidemia, unspecified: Secondary | ICD-10-CM

## 2020-05-06 LAB — BASIC METABOLIC PANEL
BUN/Creatinine Ratio: 14 (ref 9–20)
BUN: 21 mg/dL (ref 6–24)
CO2: 23 mmol/L (ref 20–29)
Calcium: 9.8 mg/dL (ref 8.7–10.2)
Chloride: 102 mmol/L (ref 96–106)
Creatinine, Ser: 1.49 mg/dL — ABNORMAL HIGH (ref 0.76–1.27)
Glucose: 97 mg/dL (ref 65–99)
Potassium: 4.8 mmol/L (ref 3.5–5.2)
Sodium: 141 mmol/L (ref 134–144)
eGFR: 54 mL/min/{1.73_m2} — ABNORMAL LOW (ref >59)

## 2020-05-06 NOTE — Progress Notes (Signed)
Cardiology Office Note:    Date:  05/06/2020   ID:  Martin Herring, DOB 1961-02-16, MRN [id number]  PCP:  Eustaquio Boyden, MD   [facility]  Cardiologist:  Peter Swaziland, MD Advanced Practice Provider:  Azalee Course PA-C Electrophysiologist:  None   Referring MD: Eustaquio Boyden, MD   Chief Complaint  Patient presents with  . Hospitalization Follow-up    Recent admission for NSTEMI, seen for Dr. Swaziland    History of Present Illness:    Martin Herring is a 60 y.o. male with a hx of obesity, hyperlipidemia and recently diagnosed CAD.  Patient was admitted to the hospital on 04/25/2020 after being transferred from [facility] ED with NSTEMI.  Prior to that, he had 3 weeks onset of progressive chest pain.  High-sensitivity troponin was elevated at 7000 prior to arrival however went up to 24,246 after arrival.  EKG showed anterior T wave inversion.  Emergent cardiac catheterization performed on 04/25/2020 revealed 95% proximal LAD lesion treated with 3.5 x 22 mm resolute DES, 80% D1 lesion treated with a 3.0 x 18 mm DES, EF was 45 to 50% by LV gram.  He had moderately elevated LVEDP on cath as well.  Echocardiogram obtained on the same day revealed EF 55 to 60%, mild LVH, no significant valve disorder, mild hypokinesis of the left ventricular apical septal wall.  Postprocedure patient was placed on aspirin and Brilinta with plan to continue Brilinta for minimum of 1 year.  Postprocedure, his creatinine increased to 1.55 before trending back down to 1.52.  Patient presents today for follow-up.  He denies any chest pain or shortness of breath.  He has no lower extremity edema, orthopnea or PND.  He has been compliant with his dual antiplatelet therapy.  EKG continue to show sinus bradycardia with TWI in V2 through V6.  We emphasized that he will need aspirin lifelong and Brilinta for a minimum of 12 months.  Unfortunately we are considerred out of network for him, he  will check with his insurance and decide whether to establish with a local cardiologist.  Overall, he appears to be doing well since his discharge.  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  He is currently tolerating carvedilol.  He does have a right-sided posterior temporal headache since yesterday morning.  I recommended continue observation.   Past Medical History:  Diagnosis Date  . CAD (coronary artery disease) 04/25/2020  . Glaucoma    No treatment currently; under close observation  . History of chicken pox   . Obesity   . Obesity (BMI 35.0-39.9 without comorbidity) 04/25/2020    Past Surgical History:  Procedure Laterality Date  . CORONARY STENT INTERVENTION N/A 04/25/2020   Procedure: CORONARY STENT INTERVENTION;  Surgeon: Swaziland, Peter M, MD;  Location: [facility] INVASIVE CV LAB;  Service: Cardiovascular;  Laterality: N/A;  . LEFT HEART CATH AND CORONARY ANGIOGRAPHY N/A 04/25/2020   Procedure: LEFT HEART CATH AND CORONARY ANGIOGRAPHY;  Surgeon: Swaziland, Peter M, MD;  Location: [facility] INVASIVE CV LAB;  Service: Cardiovascular;  Laterality: N/A;    Current Medications: Current Meds  Medication Sig  . aspirin EC 81 MG EC tablet Take 1 tablet (81 mg total) by mouth daily. Swallow whole.  Marland Kitchen atorvastatin (LIPITOR) 80 MG tablet Take 1 tablet (80 mg total) by mouth daily.  . carvedilol (COREG) 6.25 MG tablet Take 1 tablet (6.25 mg total) by mouth 2 (two) times daily with a  meal.  . ibuprofen (ADVIL) 200 MG tablet Take 200 mg by mouth every 6 (six) hours as needed.  . nitroGLYCERIN (NITROSTAT) 0.4 MG SL tablet Place 1 tablet (0.4 mg total) under the tongue every 5 (five) minutes x 3 doses as needed for chest pain.  . ticagrelor (BRILINTA) 90 MG TABS tablet Take 1 tablet (90 mg total) by mouth 2 (two) times daily.     Allergies:   Penicillins   Social History   Socioeconomic History  . Marital status: Married    Spouse name: Not on file  . Number of  children: Not on file  . Years of education: Not on file  . Highest education level: Not on file  Occupational History  . Not on file  Tobacco Use  . Smoking status: Never Smoker  . Smokeless tobacco: Current User    Types: Chew  . Tobacco comment: less then 1/2 ppd chewing  Substance and Sexual Activity  . Alcohol use: No    Alcohol/week: 0.0 standard drinks  . Drug use: No  . Sexual activity: Not on file  Other Topics Concern  . 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  ROS:   Please see the history of present illness.     All other systems reviewed and are negative.  EKGs/Labs/Other Studies Reviewed:    The following studies were reviewed today:  Cath 04/25/2020  Prox LAD lesion is 95% stenosed.  Prox LAD to Mid LAD lesion is 60% stenosed.  A drug-eluting stent was successfully placed using a STENT RESOLUTE ONYX 3.5X22.  Post intervention, there is a 0% residual stenosis.  1st Diag lesion is 80% stenosed.  A drug-eluting stent was successfully placed using a STENT RESOLUTE ONYX 3.0X18.  Post intervention, there is a 0% residual stenosis.  Post intervention, there is a 0% residual stenosis.  There is mild left ventricular systolic  dysfunction.  LV end diastolic pressure is moderately elevated.  The left ventricular ejection fraction is 45-50% by visual estimate.   1. 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 Medina class 1,1,1. 2. Mild LV dysfunction with anterior HK 3. Moderately elevated LVEDP 4. Successful PCI of the LAD/diagonal bifurcation with DES x 2 in a Culotte technique.  Plan: DAPT for one year at least. Risk factor modification. Patient is still hypertensive so IV Ntg continued post procedure.    Diagnostic Dominance: Right    Intervention      Echo 04/26/2020 1. Left ventricular ejection fraction, by estimation, is 55 to 60%. The  left ventricle has normal function. The left ventricle demonstrates  regional wall motion abnormalities (see scoring diagram/findings for  description). There is  mild concentric left  ventricular hypertrophy. Left ventricular diastolic parameters were  normal. There is mild hypokinesis of the left ventricular, apical septal  wall.  2. Right ventricular systolic function is normal. The right ventricular  size is normal.  3. The mitral valve is normal in structure. No evidence of mitral valve  regurgitation. No evidence of mitral stenosis.  4. The aortic valve is normal in structure. Aortic valve regurgitation is  not visualized. No aortic stenosis is present.  5. The inferior vena cava is normal in size with greater than 50%  respiratory variability, suggesting right atrial pressure of 3 mmHg.    EKG:  EKG is ordered today.  The ekg ordered today demonstrates sinus bradycardia, T wave inversion in lead V2 through V6.  Recent Labs: 04/26/2020: Hemoglobin 15.5; Platelets 279 04/28/2020: BUN 21; Creatinine, Ser 1.66; Potassium 4.3; Sodium 136  Recent Lipid Panel    Component Value Date/Time   CHOL 180 04/26/2020 0055   TRIG 188 (H) 04/26/2020 0055   HDL 45 04/26/2020 0055    CHOLHDL 4.0 04/26/2020 0055   VLDL 38 04/26/2020 0055   LDLCALC 97 04/26/2020 0055     Risk Assessment/Calculations:       Physical Exam:    VS:  BP 120/76   Pulse (!) 52   Ht 6\' 2"  (1.88 m)   Wt 273 lb (123.8 kg)   SpO2 99%   BMI 35.05 kg/m     Wt Readings from Last 3 Encounters:  05/06/20 273 lb (123.8 kg)  04/28/20 271 lb 9.7 oz (123.2 kg)  08/18/15 293 lb 8 oz (133.1 kg)     GEN:  Well nourished, well developed in no acute distress HEENT: Normal NECK: No JVD; No carotid bruits LYMPHATICS: No lymphadenopathy CARDIAC: RRR, no murmurs, rubs, gallops RESPIRATORY:  Clear to auscultation without rales, wheezing or rhonchi  ABDOMEN: Soft, non-tender, non-distended MUSCULOSKELETAL:  No edema; No deformity  SKIN: Warm and dry NEUROLOGIC:  Alert and oriented x 3 PSYCHIATRIC:  Normal affect   ASSESSMENT:    1. Coronary artery disease involving native coronary artery of native heart without angina pectoris   2. AKI (acute kidney injury) (HCC)   3. Hyperlipidemia LDL goal <70    PLAN:    In order of problems listed above:  1. CAD: Denies any further chest discomfort.  Continue aspirin and Brilinta.  Aspirin will be lifelong, Brilinta will continue for a minimum of 12 months.  EKG today continue to show sinus rhythm with T wave inversion in V2 through V6.  2. AKI: His creatinine prior to discharge was 1.6.  Will obtain basic metabolic panel today.  Once his renal function returned to normal, may consider add ARB to his medical regimen.  3. Hyperlipidemia: LDL goal less than 70.  Continue 80 mg Lipitor, repeat fasting lipid panel and LFTs in 2 months.      Medication Adjustments/Labs and Tests Ordered: Current medicines are reviewed at length with the patient today.  Concerns regarding medicines are outlined above.  Orders Placed This Encounter  Procedures  . Basic metabolic panel  . EKG 12-Lead   No orders of the defined types were placed in this  encounter.   Patient Instructions  Medication Instructions:  Your physician recommends that you continue on your current medications as directed. Please refer to the Current Medication list given to you today.  *If you need a refill on your cardiac medications before your next appointment, please call your pharmacy*  Lab  Work: Your physician recommends that you return for lab work TODAY:   BMET If you have labs (blood work) drawn today and your tests are completely normal, you will receive your results only by: Marland Kitchen. MyChart Message (if you have MyChart) OR . A paper copy in the mail If you have any lab test that is abnormal or we need to change your treatment, we will call you to review the results.  Testing/Procedures: NONE ordered at this time of appointment   Follow-Up: At [facility], you and your health needs are our priority.  As part of our continuing mission to provide you with exceptional heart care, we have created designated Provider Care Teams.  These Care Teams include your primary Cardiologist (physician) and Advanced Practice Providers (APPs -  Physician Assistants and Nurse Practitioners) who all work together to provide you with the care you need, when you need it.  We recommend signing up for the patient portal called "MyChart".  Sign up information is provided on this After Visit Summary.  MyChart is used to connect with patients for Virtual Visits (Telemedicine).  Patients are able to view lab/test results, encounter notes, upcoming appointments, etc.  Non-urgent messages can be sent to your provider as well.   To learn more about what you can do with MyChart, go to [email or web address].    Your next appointment:   3 month(s)  The format for your next appointment:   In Person  Provider:   Peter SwazilandJordan, MD  Other Instructions      Signed, Azalee CourseHao Serrena Linderman, PA  05/06/2020 8:44 AM    [facility]

## 2020-05-06 NOTE — Patient Instructions (Signed)
Medication Instructions:  Your physician recommends that you continue on your current medications as directed. Please refer to the Current Medication list given to you today.  *If you need a refill on your cardiac medications before your next appointment, please call your pharmacy*  Lab Work: Your physician recommends that you return for lab work TODAY:   BMET If you have labs (blood work) drawn today and your tests are completely normal, you will receive your results only by: . MyChart Message (if you have MyChart) OR . A paper copy in the mail If you have any lab test that is abnormal or we need to change your treatment, we will call you to review the results.  Testing/Procedures: NONE ordered at this time of appointment   Follow-Up: At [facility], you and your health needs are our priority.  As part of our continuing mission to provide you with exceptional heart care, we have created designated Provider Care Teams.  These Care Teams include your primary Cardiologist (physician) and Advanced Practice Providers (APPs -  Physician Assistants and Nurse Practitioners) who all work together to provide you with the care you need, when you need it.  We recommend signing up for the patient portal called "MyChart".  Sign up information is provided on this After Visit Summary.  MyChart is used to connect with patients for Virtual Visits (Telemedicine).  Patients are able to view lab/test results, encounter notes, upcoming appointments, etc.  Non-urgent messages can be sent to your provider as well.   To learn more about what you can do with MyChart, go to [email or web address].    Your next appointment:   3 month(s)  The format for your next appointment:   In Person  Provider:   Peter Jordan, MD   Other Instructions   

## 2020-05-07 ENCOUNTER — Ambulatory Visit: Payer: BLUE CROSS/BLUE SHIELD | Admitting: Family

## 2020-05-08 NOTE — Progress Notes (Signed)
Kidney function slowly improving. Normal electrolyte

## 2020-05-11 ENCOUNTER — Other Ambulatory Visit: Payer: Self-pay

## 2020-05-12 ENCOUNTER — Ambulatory Visit (INDEPENDENT_AMBULATORY_CARE_PROVIDER_SITE_OTHER): Payer: BLUE CROSS/BLUE SHIELD | Admitting: Family Medicine

## 2020-05-12 ENCOUNTER — Encounter: Payer: Self-pay | Admitting: Family Medicine

## 2020-05-12 VITALS — BP 138/82 | HR 60 | Temp 97.4°F | Ht 74.0 in | Wt 276.6 lb

## 2020-05-12 DIAGNOSIS — N289 Disorder of kidney and ureter, unspecified: Secondary | ICD-10-CM | POA: Diagnosis not present

## 2020-05-12 DIAGNOSIS — E785 Hyperlipidemia, unspecified: Secondary | ICD-10-CM | POA: Diagnosis not present

## 2020-05-12 DIAGNOSIS — I2510 Atherosclerotic heart disease of native coronary artery without angina pectoris: Secondary | ICD-10-CM | POA: Diagnosis not present

## 2020-05-12 MED ORDER — ACETAMINOPHEN 500 MG PO TABS: 500.0000 mg | ORAL_TABLET | Freq: Four times a day (QID) | ORAL | Status: AC | PRN

## 2020-05-12 NOTE — Progress Notes (Signed)
Patient ID: Martin Herring, male    DOB: [birth year], 60 y.o.   MRN: [id number]  This visit was conducted in person.  BP 138/82 (BP Location: Left Arm, Patient Position: Sitting, Cuff Size: Large)   Pulse 60   Temp (!) 97.4 F (36.3 C) (Temporal)   Ht $R'6\' 2"'bL$  (1.88 m)   Wt 276 lb 9.6 oz (125.5 kg)   SpO2 99%   BMI 35.51 kg/m    CC: hosp f/u visit  Subjective:   HPI: Martin Herring is a 60 y.o. male presenting on 05/12/2020 for Hospitalization Follow-up   Lives in [place] - we may be out of network - he will check on insurance.   Last seen 2017.  Recent hospitalization for STEMI s/p DES x2 to proximal LAD and 1st diagonal planned DAPT (aspirin + brilinta) x at least 1 yr, likely longer. Presented with 3 wk h/o progressive chest pain. Discharged on atorvastatin, carvedilol, PRN nitro SL. Saw cardiology in f/u last week.   Since out, has been feeling well. He is having trouble with new med regimen as he's not previously used to taking meds. Strong fmhx CAD/CVA (father, paternal uncle and aunt, paternal grandmother).   Acute kidney impairment possibly due to contrast as Cr on admission was 0.8.  Since home, he's started walking 1/2-1 mile/day.  He declined cardiac rehab. Planning on staying active on farm.    Admit date: 04/25/2020 Discharge date: 04/28/2020  PCP:  Ria Bush, MD Cardiologist:  Peter Martinique, MD  Advanced Practice Provider:  No care team member to display Electrophysiologist:  None   D/C diagnosis: Principal Problem:   STEMI (ST elevation myocardial infarction) [facility]) Active Problems:   Hypercholesterolemia     Relevant past medical, surgical, family and social history reviewed and updated as indicated. Interim medical history since our last visit reviewed. Allergies and medications reviewed and updated. Outpatient Medications Prior to Visit  Medication Sig Dispense Refill  . aspirin EC 81 MG EC tablet Take 1 tablet (81 mg total) by  mouth daily. Swallow whole. 30 tablet 11  . atorvastatin (LIPITOR) 80 MG tablet Take 1 tablet (80 mg total) by mouth daily. 90 tablet 3  . carvedilol (COREG) 6.25 MG tablet Take 1 tablet (6.25 mg total) by mouth 2 (two) times daily with a meal. 30 tablet 11  . ibuprofen (ADVIL) 200 MG tablet Take 200 mg by mouth every 6 (six) hours as needed.    . nitroGLYCERIN (NITROSTAT) 0.4 MG SL tablet Place 1 tablet (0.4 mg total) under the tongue every 5 (five) minutes x 3 doses as needed for chest pain. 25 tablet 1  . ticagrelor (BRILINTA) 90 MG TABS tablet Take 1 tablet (90 mg total) by mouth 2 (two) times daily. 60 tablet 11   No facility-administered medications prior to visit.     Per HPI unless specifically indicated in ROS section below Review of Systems Objective:  BP 138/82 (BP Location: Left Arm, Patient Position: Sitting, Cuff Size: Large)   Pulse 60   Temp (!) 97.4 F (36.3 C) (Temporal)   Ht $R'6\' 2"'KW$  (1.88 m)   Wt 276 lb 9.6 oz (125.5 kg)   SpO2 99%   BMI 35.51 kg/m   Wt Readings from Last 3 Encounters:  05/12/20 276 lb 9.6 oz (125.5 kg)  05/06/20 273 lb (123.8 kg)  04/28/20 271 lb 9.7 oz (123.2 kg)      Physical Exam Vitals and nursing note reviewed.  Constitutional:  Appearance: Normal appearance. He is not ill-appearing.  Eyes:     Extraocular Movements: Extraocular movements intact.     Conjunctiva/sclera: Conjunctivae normal.     Pupils: Pupils are equal, round, and reactive to light.  Cardiovascular:     Rate and Rhythm: Normal rate and regular rhythm.     Pulses: Normal pulses.     Heart sounds: Normal heart sounds. No murmur heard.   Pulmonary:     Effort: Pulmonary effort is normal. No respiratory distress.     Breath sounds: Normal breath sounds. No wheezing, rhonchi or rales.  Musculoskeletal:     Right lower leg: No edema.     Left lower leg: No edema.  Neurological:     Mental Status: He is alert.  Psychiatric:        Mood and Affect: Mood normal.         Behavior: Behavior normal.       Results for orders placed or performed in visit on 67/34/19  Basic metabolic panel  Result Value Ref Range   Glucose 97 65 - 99 mg/dL   BUN 21 6 - 24 mg/dL   Creatinine, Ser 1.49 (H) 0.76 - 1.27 mg/dL   eGFR 54 (L) >59 mL/min/1.73   BUN/Creatinine Ratio 14 9 - 20   Sodium 141 134 - 144 mmol/L   Potassium 4.8 3.5 - 5.2 mmol/L   Chloride 102 96 - 106 mmol/L   CO2 23 20 - 29 mmol/L   Calcium 9.8 8.7 - 10.2 mg/dL   Assessment & Plan:  This visit occurred during the SARS-CoV-2 public health emergency.  Safety protocols were in place, including screening questions prior to the visit, additional usage of staff PPE, and extensive cleaning of exam room while observing appropriate contact time as indicated for disinfecting solutions.   Problem List Items Addressed This Visit    Dyslipidemia    Now on high intensity atorvastatin with goal LDL <70.  The ASCVD Risk score Mikey Bussing DC Jr., et al., 2013) failed to calculate for the following reasons:   The patient has a prior MI or stroke diagnosis       CAD (coronary artery disease), native coronary artery - Primary    Presented with STEMI, s/p DES x2 to mid LAD and 1st diagonal branch, now on carvedilol, atorvastatin and DAPT (aspirin, brilinta) x at least 1 year. Overall recovering well. 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 He has cards f/u planned, appreciate their care.  Discussed avoiding NSAIDs from here on.       Renal insufficiency    Initial Cr 0.8, bumped up to 1.6 on discharge, down to 1.4 on recheck last week. ?contrast induced - discussed importance of good water intake.           Meds ordered this encounter  Medications  . acetaminophen (TYLENOL) 500 MG tablet    Sig: Take 1 tablet (500 mg total) by mouth every 6 (six) hours as needed.   No orders of the defined types were placed in this encounter.   Patient Instructions  Keep follow up with  cardiology.  Glad you're doing well after recent heart attack.  Good to see you today. Continue double blood thinner for at least the next year.  Avoid ibuprofen or other anti inflammatories. Start with tylenol for pain.  Schedule physical in 3-4 months.   Follow up plan: Return in about 3 months (around 08/12/2020) for annual exam, prior fasting for  blood work.  Ria Bush, MD

## 2020-05-12 NOTE — Assessment & Plan Note (Signed)
Initial Cr 0.8, bumped up to 1.6 on discharge, down to 1.4 on recheck last week. ?contrast induced - discussed importance of good water intake.

## 2020-05-12 NOTE — Assessment & Plan Note (Signed)
Now on high intensity atorvastatin with goal LDL <70.  The ASCVD Risk score Denman George DC Jr., et al., 2013) failed to calculate for the following reasons:   The patient has a prior MI or stroke diagnosis

## 2020-05-12 NOTE — Assessment & Plan Note (Addendum)
Presented with STEMI, s/p DES x2 to mid LAD and 1st diagonal branch, now on carvedilol, atorvastatin and DAPT (aspirin, brilinta) x at least 1 year. Overall recovering well. 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 He has cards f/u planned, appreciate their care.  Discussed avoiding NSAIDs from here on.

## 2020-05-12 NOTE — Patient Instructions (Addendum)
Keep follow up with cardiology.  Glad you're doing well after recent heart attack.  Good to see you today. Continue double blood thinner for at least the next year.  Avoid ibuprofen or other anti inflammatories. Start with tylenol for pain.  Schedule physical in 3-4 months.

## 2020-05-25 ENCOUNTER — Other Ambulatory Visit (HOSPITAL_COMMUNITY): Payer: Self-pay

## 2020-08-08 ENCOUNTER — Other Ambulatory Visit: Payer: Self-pay | Admitting: Family Medicine

## 2020-08-08 DIAGNOSIS — Z125 Encounter for screening for malignant neoplasm of prostate: Secondary | ICD-10-CM

## 2020-08-08 DIAGNOSIS — N289 Disorder of kidney and ureter, unspecified: Secondary | ICD-10-CM

## 2020-08-08 DIAGNOSIS — E0590 Thyrotoxicosis, unspecified without thyrotoxic crisis or storm: Secondary | ICD-10-CM

## 2020-08-08 DIAGNOSIS — E785 Hyperlipidemia, unspecified: Secondary | ICD-10-CM

## 2020-08-12 ENCOUNTER — Other Ambulatory Visit (INDEPENDENT_AMBULATORY_CARE_PROVIDER_SITE_OTHER): Payer: BLUE CROSS/BLUE SHIELD

## 2020-08-12 ENCOUNTER — Other Ambulatory Visit: Payer: Self-pay

## 2020-08-12 DIAGNOSIS — E0590 Thyrotoxicosis, unspecified without thyrotoxic crisis or storm: Secondary | ICD-10-CM

## 2020-08-12 DIAGNOSIS — N289 Disorder of kidney and ureter, unspecified: Secondary | ICD-10-CM

## 2020-08-12 DIAGNOSIS — Z125 Encounter for screening for malignant neoplasm of prostate: Secondary | ICD-10-CM

## 2020-08-12 DIAGNOSIS — E785 Hyperlipidemia, unspecified: Secondary | ICD-10-CM | POA: Diagnosis not present

## 2020-08-12 LAB — CBC WITH DIFFERENTIAL/PLATELET
Basophils Absolute: 0.1 10*3/uL (ref 0.0–0.1)
Basophils Relative: 1.4 % (ref 0.0–3.0)
Eosinophils Absolute: 0.3 10*3/uL (ref 0.0–0.7)
Eosinophils Relative: 4.3 % (ref 0.0–5.0)
HCT: 44.7 % (ref 39.0–52.0)
Hemoglobin: 15.1 g/dL (ref 13.0–17.0)
Lymphocytes Relative: 17.4 % (ref 12.0–46.0)
Lymphs Abs: 1.2 10*3/uL (ref 0.7–4.0)
MCHC: 33.8 g/dL (ref 30.0–36.0)
MCV: 87.4 fl (ref 78.0–100.0)
Monocytes Absolute: 0.8 10*3/uL (ref 0.1–1.0)
Monocytes Relative: 11.7 % (ref 3.0–12.0)
Neutro Abs: 4.4 10*3/uL (ref 1.4–7.7)
Neutrophils Relative %: 65.2 % (ref 43.0–77.0)
Platelets: 276 10*3/uL (ref 150.0–400.0)
RBC: 5.11 Mil/uL (ref 4.22–5.81)
RDW: 15.2 % (ref 11.5–15.5)
WBC: 6.7 10*3/uL (ref 4.0–10.5)

## 2020-08-12 LAB — VITAMIN D 25 HYDROXY (VIT D DEFICIENCY, FRACTURES): VITD: 41.67 ng/mL (ref 30.00–100.00)

## 2020-08-12 LAB — COMPREHENSIVE METABOLIC PANEL
ALT: 27 U/L (ref 0–53)
AST: 19 U/L (ref 0–37)
Albumin: 4.3 g/dL (ref 3.5–5.2)
Alkaline Phosphatase: 113 U/L (ref 39–117)
BUN: 20 mg/dL (ref 6–23)
CO2: 27 mEq/L (ref 19–32)
Calcium: 9.4 mg/dL (ref 8.4–10.5)
Chloride: 106 mEq/L (ref 96–112)
Creatinine, Ser: 1.59 mg/dL — ABNORMAL HIGH (ref 0.40–1.50)
GFR: 47.64 mL/min — ABNORMAL LOW (ref >60.00)
Glucose, Bld: 98 mg/dL (ref 70–99)
Potassium: 4.5 mEq/L (ref 3.5–5.1)
Sodium: 139 mEq/L (ref 135–145)
Total Bilirubin: 0.8 mg/dL (ref 0.2–1.2)
Total Protein: 6.6 g/dL (ref 6.0–8.3)

## 2020-08-12 LAB — PSA: PSA: 0.51 ng/mL (ref 0.10–4.00)

## 2020-08-12 LAB — LIPID PANEL
Cholesterol: 80 mg/dL (ref 0–200)
HDL: 29.9 mg/dL — ABNORMAL LOW (ref >39.00)
LDL Cholesterol: 31 mg/dL (ref 0–99)
NonHDL: 49.62
Total CHOL/HDL Ratio: 3
Triglycerides: 93 mg/dL (ref 0.0–149.0)
VLDL: 18.6 mg/dL (ref 0.0–40.0)

## 2020-08-12 LAB — T3: T3, Total: 129 ng/dL (ref 76–181)

## 2020-08-12 LAB — T4, FREE: Free T4: 0.97 ng/dL (ref 0.60–1.60)

## 2020-08-12 LAB — TSH: TSH: 1.19 u[IU]/mL (ref 0.35–4.50)

## 2020-08-13 NOTE — Progress Notes (Signed)
Cardiology Office Note:    Date:  08/17/2020   ID:  Martin Dupesonald Gerald Nakayama, DOB May 20, 1960, MRN [id number]  PCP:  Eustaquio BoydenGutierrez, Javier, MD   [facility]  Cardiologist:  Savon Bordonaro [place], MD Advanced Practice Provider:  Azalee CourseHao Meng PA-C Electrophysiologist:  None   Referring MD: Eustaquio BoydenGutierrez, Javier, MD   Chief Complaint  Patient presents with   Follow-up    3 months.   Headache   Shortness of Breath     History of Present Illness:    Martin Herring is a 60 y.o. male with a hx of obesity, hyperlipidemia and CAD.  Patient was admitted to the hospital on 04/25/2020 after [facility] with [facility].  Prior to that, he had 3 weeks onset of progressive chest pain.  High-sensitivity troponin was elevated at 7000 prior to arrival however went up to 24,246 after arrival.  EKG showed anterior T wave inversion.  Emergent cardiac catheterization performed on 04/25/2020 revealed 95% proximal LAD lesion treated with 3.5 x 22 mm Resolute DES, 80% D1 lesion treated with a 3.0 x 18 mm DES, EF was 45 to 50% by LV gram.  He had moderately elevated LVEDP on cath as well.  Echocardiogram obtained on the same day revealed EF 55 to 60%, mild LVH, no significant valve disorder, mild hypokinesis of the left ventricular apical septal wall.  Postprocedure patient was placed on aspirin and Brilinta with plan to continue Brilinta for minimum of 1 year.  Postprocedure, his creatinine increased to 1.55 and this has been stable on follow up.     Patient presents today for follow-up.  He denies any chest pain or shortness of breath.  He has no lower extremity edema, orthopnea or PND.  He has been compliant with his dual antiplatelet therapy.  EKG continue to show sinus bradycardia with TWI in V2 through V6.  We emphasized that he will need aspirin lifelong and Brilinta for a minimum of 12 months.  Unfortunately we are considerred out of network for him but he wants to continue care here  if possible.   On follow up he is doing very well. Has minor twinges of chest pain relieved with exertion. He still experiences SOB on Brilinta. 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 He avoids NSAIDs now. He is eating much healthier and has lost at least 13 lbs. He notes his back pain is much better with weight loss.    Past Medical History:  Diagnosis Date   CAD (coronary artery disease) 04/25/2020   Glaucoma    No treatment currently; under close observation   History of chicken pox    Obesity    Obesity (BMI 35.0-39.9 without comorbidity) 04/25/2020    Past Surgical History:  Procedure Laterality Date   CORONARY STENT INTERVENTION N/A 04/25/2020   Procedure: CORONARY STENT INTERVENTION;  Surgeon: [place], Isma Tietje M, MD;  [facility];  Service: Cardiovascular;  Laterality: N/A;   LEFT HEART CATH AND CORONARY ANGIOGRAPHY N/A 04/25/2020   Procedure: LEFT HEART CATH AND CORONARY ANGIOGRAPHY;  Surgeon: [place], Mandeep Ferch M, MD;  [facility];  Service: Cardiovascular;  Laterality: N/A;    Current Medications: Current Meds  Medication Sig   acetaminophen (TYLENOL) 500 MG tablet Take 1 tablet (500 mg total) by mouth every 6 (six) hours as needed.   aspirin 81 MG EC tablet TAKE 1 TABLET (  81 MG TOTAL) BY MOUTH DAILY. SWALLOW WHOLE.   atorvastatin (LIPITOR) 80 MG tablet Take 1 tablet (80 mg total) by mouth daily.   carvedilol (COREG) 6.25 MG tablet Take 1 tablet (6.25 mg total) by mouth 2 (two) times daily with a meal.   clopidogrel (PLAVIX) 75 MG tablet Take 1 tablet (75 mg total) by mouth daily.   nitroGLYCERIN (NITROSTAT) 0.4 MG SL tablet Place 1 tablet (0.4 mg total) under the tongue every 5 (five) minutes x 3 doses as needed for chest pain.   [DISCONTINUED] aspirin EC 81 MG EC tablet Take 1 tablet (81 mg total) by mouth daily. Swallow whole.   [DISCONTINUED] atorvastatin (LIPITOR) 80 MG tablet TAKE 1 TABLET (80  MG TOTAL) BY MOUTH DAILY.   [DISCONTINUED] carvedilol (COREG) 6.25 MG tablet TAKE 1 TABLET (6.25 MG TOTAL) BY MOUTH TWO TIMES DAILY WITH A MEAL.   [DISCONTINUED] ibuprofen (ADVIL) 200 MG tablet Take 200 mg by mouth every 6 (six) hours as needed.   [DISCONTINUED] nitroGLYCERIN (NITROSTAT) 0.4 MG SL tablet PLACE 1 TABLET (0.4 MG TOTAL) UNDER THE TONGUE EVERY FIVE MINUTES X 3 DOSES AS NEEDED FOR CHEST PAIN.   [DISCONTINUED] ticagrelor (BRILINTA) 90 MG TABS tablet Take 1 tablet (90 mg total) by mouth 2 (two) times daily.   [DISCONTINUED] ticagrelor (BRILINTA) 90 MG TABS tablet TAKE 1 TABLET (90 MG TOTAL) BY MOUTH TWO TIMES DAILY.     Allergies:   Penicillins   Social History   Socioeconomic History   Marital status: Married    Spouse name: Not on file   Number of children: Not on file   Years of education: Not on file   Highest education level: Not on file  Occupational History   Not on file  Tobacco Use   Smoking status: Never   Smokeless tobacco: Current    Types: Chew   Tobacco comments:    less then 1/2 ppd chewing  Substance and Sexual Activity   Alcohol use: No    Alcohol/week: 0.0 standard drinks   Drug use: No   Sexual activity: Not on file  Other Topics Concern   Not on file  Social History Narrative   Caffeine: 1 cup coffee/day   Lives with wife and daughter (88)   Occupation: owns Stage manager, 4 employee   Edu: HS   Activity: Theme park manager, 54,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  ROS:   Please see the history of present illness.     All other systems reviewed and are negative.  EKGs/Labs/Other Studies Reviewed:    The following studies were reviewed today:  Cath 04/25/2020 Prox LAD lesion is 95% stenosed. Prox LAD to Mid LAD lesion is 60% stenosed. A drug-eluting stent was successfully placed using a STENT RESOLUTE ONYX 3.5X22. Post intervention, there is a 0% residual stenosis. 1st Diag lesion is 80% stenosed. A drug-eluting stent was successfully placed using a STENT RESOLUTE ONYX 3.0X18. Post intervention,  there is a 0% residual stenosis. Post intervention, there is a 0% residual stenosis. There is mild left ventricular systolic dysfunction. LV end diastolic pressure is moderately elevated. The left ventricular ejection fraction is 45-50% by visual estimate.   1. 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 Medina class 1,1,1. 2. Mild LV dysfunction with anterior HK 3. Moderately elevated LVEDP 4. Successful PCI of the LAD/diagonal bifurcation with DES x 2 in a Culotte technique.   Plan: DAPT for one year at least. Risk factor modification. Patient is still hypertensive so IV Ntg continued post procedure.    Diagnostic Dominance: Right    Intervention      Echo 04/26/2020  1. Left ventricular ejection fraction, by estimation, is 55 to 60%. The  left ventricle has normal function. The left ventricle demonstrates  regional wall motion abnormalities (see scoring diagram/findings for  description). There is mild concentric left  ventricular hypertrophy. Left ventricular diastolic parameters were  normal. There is mild hypokinesis of the left ventricular, apical septal  wall.   2. Right ventricular systolic function is normal. The right ventricular  size is normal.   3. The mitral valve is normal in structure. No  evidence of mitral valve  regurgitation. No evidence of mitral stenosis.   4. The aortic valve is normal in structure. Aortic valve regurgitation is  not visualized. No aortic stenosis is present.   5. The inferior vena cava is normal in size with greater than 50%  respiratory variability, suggesting right atrial pressure of 3 mmHg.    EKG:  EKG is not ordered today.    Recent Labs: 08/12/2020: ALT 27; BUN 20; Creatinine, Ser 1.59; Hemoglobin 15.1; Platelets 276.0; Potassium 4.5; Sodium 139; TSH 1.19  Recent Lipid Panel    Component Value Date/Time   CHOL 80 08/12/2020 0735   TRIG 93.0 08/12/2020 0735   HDL 29.90 (L) 08/12/2020 0735   CHOLHDL 3 08/12/2020 0735   VLDL 18.6 08/12/2020 0735   LDLCALC 31 08/12/2020 0735     Risk Assessment/Calculations:       Physical Exam:    VS:  BP 110/68 (BP Location: Left Arm, Patient Position: Sitting, Cuff Size: Large)   Pulse (!) 56   Ht 6\' 2"  (1.88 m)   Wt 263 lb (119.3 kg)   BMI 33.77 kg/m     Wt Readings from Last 3 Encounters:  08/17/20 263 lb (119.3 kg)  05/12/20 276 lb 9.6 oz (125.5 kg)  05/06/20 273 lb (123.8 kg)     GEN:  Well nourished, well developed in no acute distress HEENT: Normal NECK: No JVD; No carotid bruits LYMPHATICS: No lymphadenopathy CARDIAC: RRR, no murmurs, rubs, gallops RESPIRATORY:  Clear to auscultation without rales, wheezing or rhonchi  ABDOMEN: Soft, non-tender, non-distended MUSCULOSKELETAL:  No edema; No deformity  SKIN: Warm and dry NEUROLOGIC:  Alert and oriented x 3 PSYCHIATRIC:  Normal affect   ASSESSMENT:    1. Coronary artery disease involving native coronary artery of native heart without angina pectoris   2. Hyperlipidemia LDL goal <70   3. Stage 3a chronic kidney disease (HCC)     PLAN:    In order of problems listed above:  CAD s/p NSTEMI and subsequent stenting of the LAD and first diagonal in March 2022. No significant angina.  Continue DAPT for one year.  Since he has  persistent SOB on Brilinta will switch to Plavix now. EF good on Echo.  CKD stage 3a- persistent. Avoid NSAIDs.   Hyperlipidemia: LDL goal less than 70.  Recent LDL 31 on high dose statin.   4.   Obesity. Encouraged continued weight loss.      Medication Adjustments/Labs and Tests Ordered: Current medicines are reviewed at length with the patient today.  Concerns regarding medicines are outlined above.  No orders of the defined types were placed in this encounter.  Meds ordered this encounter  Medications   clopidogrel (PLAVIX) 75 MG tablet    Sig: Take 1 tablet (75 mg total) by mouth daily.    Dispense:  90 tablet    Refill:  3     Patient Instructions  Stop Brilinta  Start Plavix instead. Take 4 tablets (300 mg) the first day then 75 mg daily  Follow up in 6 months.    Signed, Avery Klingbeil [place], MD  08/17/2020 8:31 AM    [facility]

## 2020-08-17 ENCOUNTER — Other Ambulatory Visit: Payer: Self-pay

## 2020-08-17 ENCOUNTER — Encounter: Payer: Self-pay | Admitting: Cardiology

## 2020-08-17 ENCOUNTER — Ambulatory Visit: Payer: BLUE CROSS/BLUE SHIELD | Admitting: Cardiology

## 2020-08-17 VITALS — BP 110/68 | HR 56 | Ht 74.0 in | Wt 263.0 lb

## 2020-08-17 DIAGNOSIS — E785 Hyperlipidemia, unspecified: Secondary | ICD-10-CM | POA: Diagnosis not present

## 2020-08-17 DIAGNOSIS — I2510 Atherosclerotic heart disease of native coronary artery without angina pectoris: Secondary | ICD-10-CM

## 2020-08-17 DIAGNOSIS — N1831 Chronic kidney disease, stage 3a: Secondary | ICD-10-CM

## 2020-08-17 MED ORDER — CLOPIDOGREL BISULFATE 75 MG PO TABS: 75.0000 mg | ORAL_TABLET | Freq: Every day | ORAL | 3 refills | Status: DC

## 2020-08-17 NOTE — Patient Instructions (Signed)
Stop Brilinta  Start Plavix instead. Take 4 tablets (300 mg) the first day then 75 mg daily  Follow up in 6 months.

## 2020-08-19 ENCOUNTER — Ambulatory Visit (INDEPENDENT_AMBULATORY_CARE_PROVIDER_SITE_OTHER): Payer: BLUE CROSS/BLUE SHIELD | Admitting: Family Medicine

## 2020-08-19 ENCOUNTER — Other Ambulatory Visit: Payer: Self-pay

## 2020-08-19 ENCOUNTER — Encounter: Payer: Self-pay | Admitting: Family Medicine

## 2020-08-19 VITALS — BP 120/68 | HR 54 | Temp 98.0°F | Ht 72.5 in | Wt 263.2 lb

## 2020-08-19 DIAGNOSIS — Z1211 Encounter for screening for malignant neoplasm of colon: Secondary | ICD-10-CM | POA: Diagnosis not present

## 2020-08-19 DIAGNOSIS — Z0000 Encounter for general adult medical examination without abnormal findings: Secondary | ICD-10-CM

## 2020-08-19 DIAGNOSIS — N289 Disorder of kidney and ureter, unspecified: Secondary | ICD-10-CM

## 2020-08-19 DIAGNOSIS — Z23 Encounter for immunization: Secondary | ICD-10-CM

## 2020-08-19 DIAGNOSIS — E0590 Thyrotoxicosis, unspecified without thyrotoxic crisis or storm: Secondary | ICD-10-CM

## 2020-08-19 DIAGNOSIS — I2510 Atherosclerotic heart disease of native coronary artery without angina pectoris: Secondary | ICD-10-CM

## 2020-08-19 DIAGNOSIS — E785 Hyperlipidemia, unspecified: Secondary | ICD-10-CM

## 2020-08-19 NOTE — Progress Notes (Signed)
Patient ID: Martin Herring, male    DOB: [birth year], 60 y.o.   MRN: [id number]  This visit was conducted in person.  BP 120/68   Pulse (!) 54   Temp 98 F (36.7 C) (Temporal)   Ht 6' 0.5" (1.842 m)   Wt 263 lb 3 oz (119.4 kg)   SpO2 100%   BMI 35.20 kg/m    CC: CPE Subjective:   HPI: Martin Herring is a 60 y.o. male presenting on 08/19/2020 for Annual Exam   Recent STEMI 04/2020 s/p DES x2 to prox LAD and 1st diag planned DAPT x at least 1 yr. Saw cardiology earlier this week, note reviewed. Brilinta changed to plavix due to dyspnea with marked improvement.   Working on [employer] choices (more vegetables), portion sizes.   Preventative: Colon cancer screen - discussed, recommend iFOB this year.  Prostate screen - discussed. PSA reassuring.  Flu - declines COVID - declines Td 2011, Tdap today  Seat belt - use discussed.  Sunscreen use discussed. No changing moles on skin. 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 Interim medical history since our last visit reviewed. Allergies and medications reviewed and updated. Outpatient Medications Prior to Visit  Medication Sig Dispense Refill   acetaminophen (TYLENOL) 500 MG tablet Take 1 tablet (500 mg total) by mouth every 6 (six) hours as needed.     aspirin 81 MG EC tablet TAKE 1 TABLET (81 MG TOTAL) BY MOUTH DAILY. SWALLOW WHOLE. 30 tablet 11   atorvastatin (LIPITOR) 80 MG tablet Take 1 tablet (80 mg total) by mouth daily. 90 tablet 3   carvedilol (COREG) 6.25 MG tablet Take 1 tablet (6.25 mg total) by mouth 2  (two) times daily with a meal. 30 tablet 11   clopidogrel (PLAVIX) 75 MG tablet Take 1 tablet (75 mg total) by mouth daily. 90 tablet 3   nitroGLYCERIN (NITROSTAT) 0.4 MG SL tablet Place 1 tablet (0.4 mg total) under the tongue every 5 (five) minutes x 3 doses as needed for chest pain. 25 tablet 1   No facility-administered medications prior to visit.     Per HPI unless specifically indicated in ROS section below Review of Systems  Constitutional:  Negative for activity change, appetite change, chills, fatigue, fever and unexpected weight change.  HENT:  Negative for hearing loss.   Eyes:  Negative for visual disturbance.  Respiratory:  Negative for cough, chest tightness, shortness of breath and wheezing.   Cardiovascular:  Negative for chest pain, palpitations and leg swelling.  Gastrointestinal:  Negative for abdominal distention, abdominal pain, blood in stool, constipation, diarrhea, nausea and vomiting.  Genitourinary:  Negative for difficulty urinating and hematuria.  Musculoskeletal:  Negative for arthralgias, myalgias and neck pain.  Skin:  Negative for rash.  Neurological:  Negative for dizziness, seizures, syncope and headaches.  Hematological:  Negative for adenopathy. Does not bruise/bleed easily.  Psychiatric/Behavioral:  Negative for dysphoric mood. The patient is not nervous/anxious.    Objective:  BP 120/68   Pulse (!) 54   Temp 98 F (36.7 C) (Temporal)  Ht 6' 0.5" (1.842 m)   Wt 263 lb 3 oz (119.4 kg)   SpO2 100%   BMI 35.20 kg/m   Wt Readings from Last 3 Encounters:  08/19/20 263 lb 3 oz (119.4 kg)  08/17/20 263 lb (119.3 kg)  05/12/20 276 lb 9.6 oz (125.5 kg)      Physical Exam Vitals and nursing note reviewed.  Constitutional:      General: He is not in acute distress.    Appearance: Normal appearance. He is well-developed. He is not ill-appearing.  HENT:     Head: Normocephalic and atraumatic.     Right Ear: Hearing, tympanic membrane, ear canal  and external ear normal.     Left Ear: Hearing, tympanic membrane, ear canal and external ear normal.  Eyes:     General: No scleral icterus.    Extraocular Movements: Extraocular movements intact.     Conjunctiva/sclera: Conjunctivae normal.     Pupils: Pupils are equal, round, and reactive to light.  Neck:     Thyroid: No thyroid mass or thyromegaly.  Cardiovascular:     Rate and Rhythm: Normal rate and regular rhythm.     Pulses: Normal pulses.          Radial pulses are 2+ on the right side and 2+ on the left side.     Heart sounds: Normal heart sounds. No murmur heard. Pulmonary:     Effort: Pulmonary effort is normal. No respiratory distress.     Breath sounds: Normal breath sounds. No wheezing, rhonchi or rales.  Abdominal:     General: Bowel sounds are normal. There is no distension.     Palpations: Abdomen is soft. There is no mass.     Tenderness: There is no abdominal tenderness. There is no guarding or rebound.     Hernia: No hernia is present.  Musculoskeletal:        General: Normal range of motion.     Cervical back: Normal range of motion and neck supple.     Right lower leg: No edema.     Left lower leg: No edema.  Lymphadenopathy:     Cervical: No cervical adenopathy.  Skin:    General: Skin is warm and dry.     Findings: No rash.  Neurological:     General: No focal deficit present.     Mental Status: He is alert and oriented to person, place, and time.  Psychiatric:        Mood and Affect: Mood normal.        Behavior: Behavior normal.        Thought Content: Thought content normal.        Judgment: Judgment normal.      Results for orders placed or performed in visit on 08/12/20  T3  Result Value Ref Range   T3, Total 129 76 - 181 ng/dL  PSA  Result Value Ref Range   PSA 0.51 0.10 - 4.00 ng/mL  VITAMIN D 25 Hydroxy (Vit-D Deficiency, Fractures)  Result Value Ref Range   VITD 41.67 30.00 - 100.00 ng/mL  T4, free  Result Value Ref Range   Free  T4 0.97 0.60 - 1.60 ng/dL  TSH  Result Value Ref Range   TSH 1.19 0.35 - 4.50 uIU/mL  CBC with Differential/Platelet  Result Value Ref Range   WBC 6.7 4.0 - 10.5 K/uL   RBC 5.11 4.22 - 5.81 Mil/uL   Hemoglobin 15.1 13.0 - 17.0 g/dL   HCT  44.7 39.0 - 52.0 %   MCV 87.4 78.0 - 100.0 fl   MCHC 33.8 30.0 - 36.0 g/dL   RDW 15.2 11.5 - 15.5 %   Platelets 276.0 150.0 - 400.0 K/uL   Neutrophils Relative % 65.2 43.0 - 77.0 %   Lymphocytes Relative 17.4 12.0 - 46.0 %   Monocytes Relative 11.7 3.0 - 12.0 %   Eosinophils Relative 4.3 0.0 - 5.0 %   Basophils Relative 1.4 0.0 - 3.0 %   Neutro Abs 4.4 1.4 - 7.7 K/uL   Lymphs Abs 1.2 0.7 - 4.0 K/uL   Monocytes Absolute 0.8 0.1 - 1.0 K/uL   Eosinophils Absolute 0.3 0.0 - 0.7 K/uL   Basophils Absolute 0.1 0.0 - 0.1 K/uL  Comprehensive metabolic panel  Result Value Ref Range   Sodium 139 135 - 145 mEq/L   Potassium 4.5 3.5 - 5.1 mEq/L   Chloride 106 96 - 112 mEq/L   CO2 27 19 - 32 mEq/L   Glucose, Bld 98 70 - 99 mg/dL   BUN 20 6 - 23 mg/dL   Creatinine, Ser 1.59 (H) 0.40 - 1.50 mg/dL   Total Bilirubin 0.8 0.2 - 1.2 mg/dL   Alkaline Phosphatase 113 39 - 117 U/L   AST 19 0 - 37 U/L   ALT 27 0 - 53 U/L   Total Protein 6.6 6.0 - 8.3 g/dL   Albumin 4.3 3.5 - 5.2 g/dL   GFR 47.64 (L) >60.00 mL/min   Calcium 9.4 8.4 - 10.5 mg/dL  Lipid panel  Result Value Ref Range   Cholesterol 80 0 - 200 mg/dL   Triglycerides 93.0 0.0 - 149.0 mg/dL   HDL 29.90 (L) >39.00 mg/dL   VLDL 18.6 0.0 - 40.0 mg/dL   LDL Cholesterol 31 0 - 99 mg/dL   Total CHOL/HDL Ratio 3    NonHDL 49.62     Assessment & Plan:  This visit occurred during the SARS-CoV-2 public health emergency.  Safety protocols were in place, including screening questions prior to the visit, additional usage of staff PPE, and extensive cleaning of exam room while observing appropriate contact time as indicated for disinfecting solutions.   Problem List Items Addressed This Visit     Healthcare  maintenance - Primary    Preventative protocols reviewed and updated unless pt declined. Discussed healthy diet and lifestyle.  Agrees to iFOB this year.        Severe obesity (BMI 35.0-39.9) with comorbidity ([place])    Congratulated on weight loss to date. Encouraged onging healthy diet and lifestyle changes to affect sustainable weight loss.         Subclinical hyperthyroidism    TFTs normal.        Dyslipidemia    Chronic, stable. Continue atorvastatin. Encouraged slowly increasing aerobic exercise to improve HDL.  The ASCVD Risk score Mikey Bussing DC Jr., et al., 2013) failed to calculate for the following reasons:   The patient has a prior MI or stroke diagnosis        CAD (coronary artery disease), native coronary artery    Appreciate cardiology care.  Now on plavix + aspirin in place of brilinta due to med related dyspnea.        Renal insufficiency    New persistently elevated GFR, reviewed with patient. Encouraged increased water intake, limiting NSAIDs and other nephrotoxic agents RTC 6 months renal function recheck.       Relevant Orders   Renal function panel   Parathyroid hormone,  intact (no Ca)   Microalbumin / creatinine urine ratio   Other Visit Diagnoses     Special screening for malignant neoplasms, colon       Relevant Orders   Fecal occult blood, imunochemical   Need for Tdap vaccination       Relevant Orders   Tdap vaccine greater than or equal to 7yo IM (Completed)        No orders of the defined types were placed in this encounter.  Orders Placed This Encounter  Procedures   Fecal occult blood, imunochemical    Standing Status:   Future    Standing Expiration Date:   08/19/2021   Tdap vaccine greater than or equal to 7yo IM   Renal function panel    Standing Status:   Future    Standing Expiration Date:   08/19/2021   Parathyroid hormone, intact (no Ca)    Standing Status:   Future    Standing Expiration Date:   08/19/2021   Microalbumin  / creatinine urine ratio    Standing Status:   Future    Standing Expiration Date:   08/19/2021     Patient instructions: Pass by lab to pick up stool kit.  Tdap today (tetanus and whooping cough).  You are doing well today - continue current medicines.  For kidneys, increase water intake, avoid anti inflammatories. Good to see you today Return as needed or in 6 months for follow up visit with labs prior.   Follow up plan: Return in about 6 months (around 02/18/2021), or if symptoms worsen or fail to improve, for follow up visit.  Ria Bush, MD

## 2020-08-19 NOTE — Assessment & Plan Note (Addendum)
New persistently elevated GFR, reviewed with patient. Encouraged increased water intake, limiting NSAIDs and other nephrotoxic agents RTC 6 months renal function recheck.

## 2020-08-19 NOTE — Assessment & Plan Note (Addendum)
Preventative protocols reviewed and updated unless pt declined. Discussed healthy diet and lifestyle.  Agrees to iFOB this year.

## 2020-08-19 NOTE — Assessment & Plan Note (Signed)
Appreciate cardiology care.  Now on plavix + aspirin in place of brilinta due to med related dyspnea.

## 2020-08-19 NOTE — Assessment & Plan Note (Signed)
TFTs normal.

## 2020-08-19 NOTE — Assessment & Plan Note (Signed)
Chronic, stable. Continue atorvastatin. Encouraged slowly increasing aerobic exercise to improve HDL.  The ASCVD Risk score Denman George DC Jr., et al., 2013) failed to calculate for the following reasons:   The patient has a prior MI or stroke diagnosis

## 2020-08-19 NOTE — Assessment & Plan Note (Addendum)
Congratulated on weight loss to date. Encouraged onging healthy diet and lifestyle changes to affect sustainable weight loss.

## 2020-08-19 NOTE — Patient Instructions (Addendum)
Pass by lab to pick up stool kit.  Tdap today (tetanus and whooping cough).  You are doing well today - continue current medicines.  For kidneys, increase water intake, avoid anti inflammatories. Good to see you today Return as needed or in 6 months for follow up visit with labs prior.   Health Maintenance, Male Adopting a healthy lifestyle and getting preventive care are important in promoting health and wellness. Ask your health care provider about: The right schedule for you to have regular tests and exams. Things you can do on your own to prevent diseases and keep yourself healthy. What should I know about diet, weight, and exercise? Eat a healthy diet  Eat a diet that includes plenty of vegetables, fruits, low-fat dairy products, and lean protein. Do not eat a lot of foods that are high in solid fats, added sugars, or sodium.  Maintain a healthy weight Body mass index (BMI) is a measurement that can be used to identify possible weight problems. It estimates body fat based on height and weight. Your health care provider can help determine your BMI and help you achieve or maintain ahealthy weight. Get regular exercise Get regular exercise. This is one of the most important things you can do for your health. Most adults should: Exercise for at least 150 minutes each week. The exercise should increase your heart rate and make you sweat (moderate-intensity exercise). Do strengthening exercises at least twice a week. This is in addition to the moderate-intensity exercise. Spend less time sitting. Even light physical activity can be beneficial. Watch cholesterol and blood lipids Have your blood tested for lipids and cholesterol at 58 years of age, then havethis test every 5 years. You may need to have your cholesterol levels checked more often if: Your lipid or cholesterol levels are high. You are older than 58 years of age. You are at high risk for heart disease. What should I know about  cancer screening? Many types of cancers can be detected early and may often be prevented. Depending on your health history and family history, you may need to have cancer screening at various ages. This may include screening for: Colorectal cancer. Prostate cancer. Skin cancer. Lung cancer. What should I know about heart disease, diabetes, and high blood pressure? Blood pressure and heart disease High blood pressure causes heart disease and increases the risk of stroke. This is more likely to develop in people who have high blood pressure readings, are of African descent, or are overweight. Talk with your health care provider about your target blood pressure readings. Have your blood pressure checked: Every 3-5 years if you are 74-89 years of age. Every year if you are 15 years old or older. If you are between the ages of 12 and 96 and are a current or former smoker, ask your health care provider if you should have a one-time screening for abdominal aortic aneurysm (AAA). Diabetes Have regular diabetes screenings. This checks your fasting blood sugar level. Have the screening done: Once every three years after age 65 if you are at a normal weight and have a low risk for diabetes. More often and at a younger age if you are overweight or have a high risk for diabetes. What should I know about preventing infection? Hepatitis B If you have a higher risk for hepatitis B, you should be screened for this virus. Talk with your health care provider to find out if you are at risk forhepatitis B infection. Hepatitis C Blood  testing is recommended for: Everyone born from [birth year] through 1963. Anyone with known risk factors for hepatitis C. Sexually transmitted infections (STIs) You should be screened each year for STIs, including gonorrhea and chlamydia, if: You are sexually active and are younger than 58 years of age. You are older than 58 years of age and your health care provider tells you that you  are at risk for this type of infection. Your sexual activity has changed since you were last screened, and you are at increased risk for chlamydia or gonorrhea. Ask your health care provider if you are at risk. Ask your health care provider about whether you are at high risk for HIV. Your health care provider may recommend a prescription medicine to help prevent HIV infection. If you choose to take medicine to prevent HIV, you should first get tested for HIV. You should then be tested every 3 months for as long as you are taking the medicine. Follow these instructions at home: Lifestyle Do not use any products that contain nicotine or tobacco, such as cigarettes, e-cigarettes, and chewing tobacco. If you need help quitting, ask your health care provider. Do not use street drugs. Do not share needles. Ask your health care provider for help if you need support or information about quitting drugs. Alcohol use Do not drink alcohol if your health care provider tells you not to drink. If you drink alcohol: Limit how much you have to 0-2 drinks a day. Be aware of how much alcohol is in your drink. In the [place] one drink equals one 12 oz bottle of beer (355 mL), one 5 oz glass of wine (148 mL), or one 1 oz glass of hard liquor (44 mL). General instructions Schedule regular health, dental, and eye exams. Stay current with your vaccines. Tell your health care provider if: You often feel depressed. You have ever been abused or do not feel safe at home. Summary Adopting a healthy lifestyle and getting preventive care are important in promoting health and wellness. Follow your health care provider's instructions about healthy diet, exercising, and getting tested or screened for diseases. Follow your health care provider's instructions on monitoring your cholesterol and blood pressure. This information is not intended to replace advice given to you by your health care provider. Make sure you discuss any  questions you have with your healthcare provider. Document Revised: 01/31/2018 Document Reviewed: 01/31/2018 Elsevier Patient Education  2022 [employer].

## 2020-08-21 ENCOUNTER — Other Ambulatory Visit (INDEPENDENT_AMBULATORY_CARE_PROVIDER_SITE_OTHER): Payer: BLUE CROSS/BLUE SHIELD

## 2020-08-21 ENCOUNTER — Telehealth: Payer: Self-pay | Admitting: Radiology

## 2020-08-21 DIAGNOSIS — Z1211 Encounter for screening for malignant neoplasm of colon: Secondary | ICD-10-CM | POA: Diagnosis not present

## 2020-08-21 DIAGNOSIS — I2510 Atherosclerotic heart disease of native coronary artery without angina pectoris: Secondary | ICD-10-CM

## 2020-08-21 DIAGNOSIS — R195 Other fecal abnormalities: Secondary | ICD-10-CM

## 2020-08-21 LAB — FECAL OCCULT BLOOD, IMMUNOCHEMICAL: Fecal Occult Bld: POSITIVE — AB

## 2020-08-21 NOTE — Telephone Encounter (Signed)
Plz notify stool test returned positive for blood.  Any hemorrhoids recently? Given positive test result, recommend GI referral.  Given double blood thinner use for at least 1 year after MI (04/2020), they may want to postpone colonoscopy but I at least want him evaluated by GI to discuss options.  Referral placed.

## 2020-08-21 NOTE — Telephone Encounter (Signed)
[facility] called a POSITIVE ifob, results given to Dr Gutierrez 

## 2020-08-25 NOTE — Telephone Encounter (Signed)
Spoke with pt relaying Dr. G's message. Pt verbalizes understanding.  

## 2020-08-27 ENCOUNTER — Telehealth: Payer: Self-pay | Admitting: Family Medicine

## 2020-08-27 DIAGNOSIS — R195 Other fecal abnormalities: Secondary | ICD-10-CM

## 2020-08-27 NOTE — Telephone Encounter (Signed)
Received notice from GI that pt is out of network with [facility] - I have placed new referral to GI.

## 2020-08-31 NOTE — Telephone Encounter (Signed)
A user error has taken place: encounter opened in error, closed for administrative reasons.

## 2021-02-05 ENCOUNTER — Telehealth: Payer: Self-pay | Admitting: Family Medicine

## 2021-02-05 NOTE — Telephone Encounter (Signed)
Called pt about appt and pt stated that he has been out of network with [facility] for awhile now. Pt stated that he has switched to [facility] in [place] and they sent a medical release paper to [facility] in September for the pt records and they have not received anything yet

## 2021-02-05 NOTE — Telephone Encounter (Signed)
I have removed myself as PCP. I don't know what to do about pt med records request.

## 2021-02-12 ENCOUNTER — Other Ambulatory Visit: Payer: BLUE CROSS/BLUE SHIELD

## 2021-02-19 ENCOUNTER — Ambulatory Visit: Payer: BLUE CROSS/BLUE SHIELD | Admitting: Family Medicine

## 2021-02-19 NOTE — Telephone Encounter (Signed)
Amy when you have a minute can you investigate the medical records request and see whats going on there?

## 2021-02-24 NOTE — Telephone Encounter (Signed)
Spoke to Mr. Martin Herring. Received the number for [facility] and called them to get another medical record request faxed over.  Once received, I will send to medical records and follow-up with the patient.

## 2021-03-03 NOTE — Telephone Encounter (Signed)
Thank you Amy!  ................

## 2021-03-09 ENCOUNTER — Other Ambulatory Visit: Payer: Self-pay

## 2021-03-09 ENCOUNTER — Encounter: Payer: Self-pay | Admitting: Physician Assistant

## 2021-03-09 ENCOUNTER — Ambulatory Visit: Payer: BLUE CROSS/BLUE SHIELD | Admitting: Physician Assistant

## 2021-03-09 VITALS — BP 134/82 | HR 60 | Ht 72.0 in | Wt 266.8 lb

## 2021-03-09 DIAGNOSIS — I2510 Atherosclerotic heart disease of native coronary artery without angina pectoris: Secondary | ICD-10-CM

## 2021-03-09 DIAGNOSIS — Z79899 Other long term (current) drug therapy: Secondary | ICD-10-CM | POA: Diagnosis not present

## 2021-03-09 MED ORDER — CLOPIDOGREL BISULFATE 75 MG PO TABS: 75.0000 mg | ORAL_TABLET | Freq: Every day | ORAL | 3 refills | Status: AC

## 2021-03-09 MED ORDER — ATORVASTATIN CALCIUM 80 MG PO TABS: 80.0000 mg | ORAL_TABLET | Freq: Every day | ORAL | 3 refills | Status: AC

## 2021-03-09 MED ORDER — CARVEDILOL 6.25 MG PO TABS: 6.2500 mg | ORAL_TABLET | Freq: Two times a day (BID) | ORAL | 3 refills | Status: DC

## 2021-03-09 NOTE — Patient Instructions (Signed)
Medication Instructions:  Your physician recommends that you continue on your current medications as directed. Please refer to the Current Medication list given to you today.   MEDICATIONS REFILLED TODAY Carvedilol 6.125 mg twice daily #180 with 3 rfs Lipitor 80 mg daily #90 with 3 rfs Plavix 75 mg daily #90 with 3 rfs  *If you need a refill on your cardiac medications before your next appointment, please call your pharmacy*   Lab Work: NONE ordered at this time of appointment   If you have labs (blood work) drawn today and your tests are completely normal, you will receive your results only by: MyChart Message (if you have MyChart) OR A paper copy in the mail If you have any lab test that is abnormal or we need to change your treatment, we will call you to review the results.   Testing/Procedures: NONE ordered at this time of appointment     Follow-Up: At [facility], you and your health needs are our priority.  As part of our continuing mission to provide you with exceptional heart care, we have created designated Provider Care Teams.  These Care Teams include your primary Cardiologist (physician) and Advanced Practice Providers (APPs -  Physician Assistants and Nurse Practitioners) who all work together to provide you with the care you need, when you need it.  We recommend signing up for the patient portal called "MyChart".  Sign up information is provided on this After Visit Summary.  MyChart is used to connect with patients for Virtual Visits (Telemedicine).  Patients are able to view lab/test results, encounter notes, upcoming appointments, etc.  Non-urgent messages can be sent to your provider as well.   To learn more about what you can do with MyChart, go to [email or web address].    Your next appointment:   6 months with [facility] Cardiologist  The format for your next appointment:   In Person  Provider:       Other Instructions You have been referred to [facility]  CARDIOLOGY

## 2021-03-09 NOTE — Progress Notes (Signed)
Cardiology Office Note:    Date:  03/09/2021   ID:  Martin Herring, DOB 25-Feb-1960, MRN [id number]  PCP:  No primary care provider on file.   [facility] Providers Cardiologist:  Peter Martinique, MD   Plan to establish with [facility] cardiology  Referring MD: Ria Bush, MD   Chief Complaint  Patient presents with   Follow-up    Seen for Dr. Martinique    History of Present Illness:    Martin Herring is a 61 y.o. male with a hx of obesity, hyperlipidemia and CAD.  Patient was admitted to the hospital on 04/25/2020 after being transferred from [facility] ED with NSTEMI.  Prior to that, he had 3 weeks onset of progressive chest pain.  High-sensitivity troponin was elevated at 7000 prior to arrival however went up to 24,246 after arrival.  EKG showed anterior T wave inversion.  Emergent cardiac catheterization performed on 04/25/2020 revealed 95% proximal LAD lesion treated with 3.5 x 22 mm resolute DES, 80% D1 lesion treated with a 3.0 x 18 mm DES, EF was 45 to 50% by LV gram.  He had moderately elevated LVEDP on cath as well.  Echocardiogram obtained on the same day revealed EF 55 to 60%, mild LVH, no significant valve disorder, mild hypokinesis of the left ventricular apical septal wall.  Postprocedure patient was placed on aspirin and Brilinta with plan to continue Brilinta for minimum of 1 year.  Brilinta was later switched to Plavix due to side effect of dyspnea.  Postprocedure, his creatinine increased to 1.55 before trending back down to 1.52.    Patient presents today for cardiology follow-up.  He mentioned he has not established with a new primary care provider due to insurance change.  He reported well-controlled cholesterol in September 2022.  His new PCP has also referred the patient to a nephrologist who has obtained a kidney ultrasound.  He does not know the result of his kidney ultrasound.  He denies any recent chest pain or worsening dyspnea.  He still works on the farm.   Unfortunately his wife had a stroke in August and is slowly recovering.  Overall, he has been doing quite well from a cardiac perspective.  Blood pressure is fairly controlled.  Continue on aspirin, Lipitor, carvedilol and Plavix.  Although technically, he can potentially stop the Plavix this August, however I would like to defer this decision to his cardiologist.  Since he had a proximal LAD stent, for the time being I would prefer him to continue on the Plavix unless instructed otherwise by his cardiologist.  He is not having any major bleeding or side effect associated with Plavix therapy.  Due to the insurance change, unfortunately our cardiology office is outside of his network and he wished to be referred to a [facility] cardiologist.  I will check with Dr. Martinique for recommendation.    Past Medical History:  Diagnosis Date   CAD (coronary artery disease) 04/25/2020   Glaucoma    No treatment currently; under close observation   History of chicken pox    Obesity    Obesity (BMI 35.0-39.9 without comorbidity) 04/25/2020    Past Surgical History:  Procedure Laterality Date   CORONARY STENT INTERVENTION N/A 04/25/2020   Procedure: CORONARY STENT INTERVENTION;  Surgeon: Martinique, Peter M, MD;  Location: [facility];  Service: Cardiovascular;  Laterality: N/A;   LEFT HEART CATH AND CORONARY ANGIOGRAPHY N/A 04/25/2020   Procedure: LEFT HEART CATH AND CORONARY ANGIOGRAPHY;  Surgeon:  Martinique, Peter M, MD;  Location: [facility];  Service: Cardiovascular;  Laterality: N/A;    Current Medications: Current Meds  Medication Sig   acetaminophen (TYLENOL) 500 MG tablet Take 1 tablet (500 mg total) by mouth every 6 (six) hours as needed.   aspirin 81 MG EC tablet TAKE 1 TABLET (81 MG TOTAL) BY MOUTH DAILY. SWALLOW WHOLE.   nitroGLYCERIN (NITROSTAT) 0.4 MG SL tablet Place 1 tablet (0.4 mg total) under the tongue every 5 (five) minutes x 3 doses as needed for chest pain.   [DISCONTINUED] atorvastatin  (LIPITOR) 80 MG tablet Take 1 tablet (80 mg total) by mouth daily.   [DISCONTINUED] carvedilol (COREG) 6.25 MG tablet Take 1 tablet (6.25 mg total) by mouth 2 (two) times daily with a meal.   [DISCONTINUED] clopidogrel (PLAVIX) 75 MG tablet Take 1 tablet (75 mg total) by mouth daily.     Allergies:   Penicillins   Social History   Socioeconomic History   Marital status: Married    Spouse name: Not on file   Number of children: Not on file   Years of education: Not on file   Highest education level: Not on file  Occupational History   Not on file  Tobacco Use   Smoking status: Never   Smokeless tobacco: Current    Types: Chew   Tobacco comments:    less then 1/2 ppd chewing  Substance and Sexual Activity   Alcohol use: No    Alcohol/week: 0.0 standard drinks   Drug use: No   Sexual activity: Not on file  Other Topics Concern   Not on file  Social History Narrative   Caffeine: 1 cup coffee/day   Lives with wife and daughter (31)   Occupation: owns Education officer, community, 4 employee   Edu: HS   Activity: Statistician, 54,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  ROS:   Please see the history of present illness.     All other systems reviewed and are negative.  EKGs/Labs/Other Studies Reviewed:    The following studies were reviewed today:  Echo 04/26/2020  1. Left ventricular ejection fraction, by estimation, is 55 to 60%. The  left ventricle  has normal function. The left ventricle demonstrates  regional wall motion abnormalities (see scoring diagram/findings for  description). There is mild concentric left  ventricular hypertrophy. Left ventricular diastolic parameters were  normal. There is mild hypokinesis of the left ventricular, apical septal  wall.   2. Right ventricular systolic function is normal. The right ventricular  size is normal.   3. The mitral valve is normal in structure. No evidence of mitral valve  regurgitation. No evidence of mitral stenosis.   4. The aortic valve is normal in structure. Aortic valve regurgitation is  not visualized. No aortic stenosis is present.   5. The inferior vena cava is normal in size with greater than 50%  respiratory variability, suggesting right atrial pressure  of 3 mmHg.   Cath 04/25/2020 Prox LAD lesion is 95% stenosed. Prox LAD to Mid LAD lesion is 60% stenosed. A drug-eluting stent was successfully placed using a STENT RESOLUTE ONYX 3.5X22. Post intervention, there is a 0% residual stenosis. 1st Diag lesion is 80% stenosed. A drug-eluting stent was successfully placed using a STENT RESOLUTE ONYX 3.0X18. Post intervention, there is a 0% residual stenosis. Post intervention, there is a 0% residual stenosis. There is mild left ventricular systolic dysfunction. LV end diastolic pressure is moderately elevated. The left ventricular ejection fraction is 45-50% by visual estimate.   1. 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 Medina class 1,1,1. 2. Mild LV dysfunction with anterior HK 3. Moderately elevated LVEDP 4. Successful PCI of the LAD/diagonal bifurcation with DES x 2 in a Culotte technique.   Plan: DAPT for one year at least. Risk factor modification. Patient is still hypertensive so IV Ntg continued post procedure.   EKG:  EKG is ordered today.  The ekg ordered today demonstrates sinus  bradycardia, no significant ST-T wave changes  Recent Labs: 08/12/2020: ALT 27; BUN 20; Creatinine, Ser 1.59; Hemoglobin 15.1; Platelets 276.0; Potassium 4.5; Sodium 139; TSH 1.19  Recent Lipid Panel    Component Value Date/Time   CHOL 80 08/12/2020 0735   TRIG 93.0 08/12/2020 0735   HDL 29.90 (L) 08/12/2020 0735   CHOLHDL 3 08/12/2020 0735   VLDL 18.6 08/12/2020 0735   LDLCALC 31 08/12/2020 0735     Risk Assessment/Calculations:           Physical Exam:    VS:  BP 134/82    Pulse 60    Ht 6' (1.829 m)    Wt 266 lb 12.8 oz (121 kg)    SpO2 100%    BMI 36.18 kg/m     Wt Readings from Last 3 Encounters:  03/09/21 266 lb 12.8 oz (121 kg)  08/19/20 263 lb 3 oz (119.4 kg)  08/17/20 263 lb (119.3 kg)     GEN:  Well nourished, well developed in no acute distress HEENT: Normal NECK: No JVD; No carotid bruits LYMPHATICS: No lymphadenopathy CARDIAC: RRR, no murmurs, rubs, gallops RESPIRATORY:  Clear to auscultation without rales, wheezing or rhonchi  ABDOMEN: Soft, non-tender, non-distended MUSCULOSKELETAL:  No edema; No deformity  SKIN: Warm and dry NEUROLOGIC:  Alert and oriented x 3 PSYCHIATRIC:  Normal affect   ASSESSMENT:    1. Coronary artery disease involving native coronary artery of native heart without angina pectoris   2. Medication management    PLAN:    In order of problems listed above:  CAD: Previously had NSTEMI with troponin up to 24,000 in March 2022.  He has dyspnea a side effect associated with Brilinta, Brilinta has since been switched to Plavix.  He is still on aspirin and Plavix therapy.  Given proximal LAD stent, will continue on dual antiplatelet therapy for now.  Hyperlipidemia: LDL goal less than 70.  Continue Lipitor.  Last lipid panel obtained by PCP in September 2022 according to the patient.  LDL was at goal at the time.  Asymptomatic bradycardia: Currently on carvedilol 6.25 mg twice a day.  Heart rate in the low 50s, today's EKG shows  heart rate 48 bpm.  Patient is largely asymptomatic.  We will continue on the current dose of carvedilol for now.  If he have more persistent dizziness in the future, he has been instructed to  monitor his blood pressure and to call us if his heart rate is less than 45 bpm, in which case we may need to cut the carvedilol in half.      Case discussed with Dr. Martinique, who is not familiar with [facility] cardiology service.  We will make a general referral to [facility] cardiology service to take over as his primary cardiologist due to insurance change.   Medication Adjustments/Labs and Tests Ordered: Current medicines are reviewed at length with the patient today.  Concerns regarding medicines are outlined above.  Orders Placed This Encounter  Procedures   Ambulatory referral to Cardiology   EKG 12-Lead   Meds ordered this encounter  Medications   clopidogrel (PLAVIX) 75 MG tablet    Sig: Take 1 tablet (75 mg total) by mouth daily.    Dispense:  90 tablet    Refill:  3   carvedilol (COREG) 6.25 MG tablet    Sig: Take 1 tablet (6.25 mg total) by mouth 2 (two) times daily with a meal.    Dispense:  180 tablet    Refill:  3   atorvastatin (LIPITOR) 80 MG tablet    Sig: Take 1 tablet (80 mg total) by mouth daily.    Dispense:  90 tablet    Refill:  3    Patient Instructions  Medication Instructions:  Your physician recommends that you continue on your current medications as directed. Please refer to the Current Medication list given to you today.   MEDICATIONS REFILLED TODAY Carvedilol 6.125 mg twice daily #180 with 3 rfs Lipitor 80 mg daily #90 with 3 rfs Plavix 75 mg daily #90 with 3 rfs  *If you need a refill on your cardiac medications before your next appointment, please call your pharmacy*   Lab Work: NONE ordered at this time of appointment   If you have labs (blood work) drawn today and your tests are completely normal, you will receive your results only by: [place] (if you  have MyChart) OR A paper copy in the mail If you have any lab test that is abnormal or we need to change your treatment, we will call you to review the results.   Testing/Procedures: NONE ordered at this time of appointment     Follow-Up: At [facility], you and your health needs are our priority.  As part of our continuing mission to provide you with exceptional heart care, we have created designated Provider Care Teams.  These Care Teams include your primary Cardiologist (physician) and Advanced Practice Providers (APPs -  Physician Assistants and Nurse Practitioners) who all work together to provide you with the care you need, when you need it.  We recommend signing up for the patient portal called "MyChart".  Sign up information is provided on this After Visit Summary.  MyChart is used to connect with patients for Virtual Visits (Telemedicine).  Patients are able to view lab/test results, encounter notes, upcoming appointments, etc.  Non-urgent messages can be sent to your provider as well.   To learn more about what you can do with MyChart, go to [email or web address].    Your next appointment:   6 months with [facility] Cardiologist  The format for your next appointment:   In Person  Provider:       Other Instructions You have been referred to [place], Almyra Deforest, [place]  03/09/2021 4:06 PM    [place]

## 2021-04-22 ENCOUNTER — Other Ambulatory Visit: Payer: Self-pay

## 2021-04-22 MED ORDER — CARVEDILOL 6.25 MG PO TABS: 6.2500 mg | ORAL_TABLET | Freq: Two times a day (BID) | ORAL | 3 refills | Status: AC

## 2021-05-18 ENCOUNTER — Encounter (INDEPENDENT_AMBULATORY_CARE_PROVIDER_SITE_OTHER): Payer: Self-pay | Admitting: *Deleted

## 2021-10-29 ENCOUNTER — Encounter (INDEPENDENT_AMBULATORY_CARE_PROVIDER_SITE_OTHER): Payer: Self-pay | Admitting: *Deleted
